# Patient Record
Sex: Female | Born: 1940 | Race: White | Hispanic: No | Marital: Single | Smoking: Former smoker
Health system: Southern US, Community
[De-identification: ages and names within clinical notes are randomized; demographics above are authoritative.]

## PROBLEM LIST (undated history)

## (undated) DIAGNOSIS — E119 Type 2 diabetes mellitus without complications: Secondary | ICD-10-CM

## (undated) DIAGNOSIS — C801 Malignant (primary) neoplasm, unspecified: Secondary | ICD-10-CM

## (undated) DIAGNOSIS — R413 Other amnesia: Secondary | ICD-10-CM

## (undated) DIAGNOSIS — I5181 Takotsubo syndrome: Secondary | ICD-10-CM

## (undated) DIAGNOSIS — J449 Chronic obstructive pulmonary disease, unspecified: Secondary | ICD-10-CM

## (undated) HISTORY — PX: ABDOMINAL HYSTERECTOMY: SHX81

---

## 2016-01-17 DIAGNOSIS — E1122 Type 2 diabetes mellitus with diabetic chronic kidney disease: Secondary | ICD-10-CM | POA: Diagnosis not present

## 2016-01-17 DIAGNOSIS — N183 Chronic kidney disease, stage 3 (moderate): Secondary | ICD-10-CM | POA: Diagnosis not present

## 2016-01-17 DIAGNOSIS — Z794 Long term (current) use of insulin: Secondary | ICD-10-CM | POA: Diagnosis not present

## 2016-01-22 DIAGNOSIS — I129 Hypertensive chronic kidney disease with stage 1 through stage 4 chronic kidney disease, or unspecified chronic kidney disease: Secondary | ICD-10-CM | POA: Diagnosis not present

## 2016-01-22 DIAGNOSIS — J22 Unspecified acute lower respiratory infection: Secondary | ICD-10-CM | POA: Diagnosis not present

## 2016-01-22 DIAGNOSIS — N183 Chronic kidney disease, stage 3 (moderate): Secondary | ICD-10-CM | POA: Diagnosis not present

## 2016-01-22 DIAGNOSIS — J449 Chronic obstructive pulmonary disease, unspecified: Secondary | ICD-10-CM | POA: Diagnosis not present

## 2016-02-05 DIAGNOSIS — R911 Solitary pulmonary nodule: Secondary | ICD-10-CM | POA: Diagnosis not present

## 2016-02-05 DIAGNOSIS — J439 Emphysema, unspecified: Secondary | ICD-10-CM | POA: Diagnosis not present

## 2016-02-05 DIAGNOSIS — I700 Atherosclerosis of aorta: Secondary | ICD-10-CM | POA: Diagnosis not present

## 2016-02-05 DIAGNOSIS — I2510 Atherosclerotic heart disease of native coronary artery without angina pectoris: Secondary | ICD-10-CM | POA: Diagnosis not present

## 2016-02-05 DIAGNOSIS — J22 Unspecified acute lower respiratory infection: Secondary | ICD-10-CM | POA: Diagnosis not present

## 2016-02-05 DIAGNOSIS — N183 Chronic kidney disease, stage 3 (moderate): Secondary | ICD-10-CM | POA: Diagnosis not present

## 2016-02-05 DIAGNOSIS — I5181 Takotsubo syndrome: Secondary | ICD-10-CM | POA: Diagnosis not present

## 2016-02-05 DIAGNOSIS — J449 Chronic obstructive pulmonary disease, unspecified: Secondary | ICD-10-CM | POA: Diagnosis not present

## 2016-02-11 DIAGNOSIS — K1120 Sialoadenitis, unspecified: Secondary | ICD-10-CM | POA: Diagnosis not present

## 2016-02-16 DIAGNOSIS — M546 Pain in thoracic spine: Secondary | ICD-10-CM | POA: Diagnosis not present

## 2016-02-16 DIAGNOSIS — Z794 Long term (current) use of insulin: Secondary | ICD-10-CM | POA: Diagnosis not present

## 2016-02-16 DIAGNOSIS — N183 Chronic kidney disease, stage 3 (moderate): Secondary | ICD-10-CM | POA: Diagnosis not present

## 2016-02-16 DIAGNOSIS — J449 Chronic obstructive pulmonary disease, unspecified: Secondary | ICD-10-CM | POA: Diagnosis not present

## 2016-02-16 DIAGNOSIS — E1122 Type 2 diabetes mellitus with diabetic chronic kidney disease: Secondary | ICD-10-CM | POA: Diagnosis not present

## 2016-02-17 DIAGNOSIS — I129 Hypertensive chronic kidney disease with stage 1 through stage 4 chronic kidney disease, or unspecified chronic kidney disease: Secondary | ICD-10-CM | POA: Diagnosis not present

## 2016-02-17 DIAGNOSIS — N183 Chronic kidney disease, stage 3 (moderate): Secondary | ICD-10-CM | POA: Diagnosis not present

## 2016-02-17 DIAGNOSIS — J449 Chronic obstructive pulmonary disease, unspecified: Secondary | ICD-10-CM | POA: Diagnosis not present

## 2016-02-17 DIAGNOSIS — F413 Other mixed anxiety disorders: Secondary | ICD-10-CM | POA: Diagnosis not present

## 2016-02-17 DIAGNOSIS — I5181 Takotsubo syndrome: Secondary | ICD-10-CM | POA: Diagnosis not present

## 2016-03-10 DIAGNOSIS — E1122 Type 2 diabetes mellitus with diabetic chronic kidney disease: Secondary | ICD-10-CM | POA: Diagnosis not present

## 2016-03-10 DIAGNOSIS — J449 Chronic obstructive pulmonary disease, unspecified: Secondary | ICD-10-CM | POA: Diagnosis not present

## 2016-03-10 DIAGNOSIS — N183 Chronic kidney disease, stage 3 (moderate): Secondary | ICD-10-CM | POA: Diagnosis not present

## 2016-03-10 DIAGNOSIS — E039 Hypothyroidism, unspecified: Secondary | ICD-10-CM | POA: Diagnosis not present

## 2016-03-10 DIAGNOSIS — Z794 Long term (current) use of insulin: Secondary | ICD-10-CM | POA: Diagnosis not present

## 2016-03-10 DIAGNOSIS — I129 Hypertensive chronic kidney disease with stage 1 through stage 4 chronic kidney disease, or unspecified chronic kidney disease: Secondary | ICD-10-CM | POA: Diagnosis not present

## 2016-03-10 DIAGNOSIS — E1142 Type 2 diabetes mellitus with diabetic polyneuropathy: Secondary | ICD-10-CM | POA: Diagnosis not present

## 2016-03-11 DIAGNOSIS — N183 Chronic kidney disease, stage 3 (moderate): Secondary | ICD-10-CM | POA: Diagnosis not present

## 2016-03-11 DIAGNOSIS — E1122 Type 2 diabetes mellitus with diabetic chronic kidney disease: Secondary | ICD-10-CM | POA: Diagnosis not present

## 2016-03-11 DIAGNOSIS — I129 Hypertensive chronic kidney disease with stage 1 through stage 4 chronic kidney disease, or unspecified chronic kidney disease: Secondary | ICD-10-CM | POA: Diagnosis not present

## 2016-03-11 DIAGNOSIS — Z794 Long term (current) use of insulin: Secondary | ICD-10-CM | POA: Diagnosis not present

## 2016-03-19 DIAGNOSIS — Z794 Long term (current) use of insulin: Secondary | ICD-10-CM | POA: Diagnosis not present

## 2016-03-19 DIAGNOSIS — E1142 Type 2 diabetes mellitus with diabetic polyneuropathy: Secondary | ICD-10-CM | POA: Diagnosis not present

## 2016-05-01 DIAGNOSIS — Z794 Long term (current) use of insulin: Secondary | ICD-10-CM | POA: Diagnosis not present

## 2016-05-01 DIAGNOSIS — N183 Chronic kidney disease, stage 3 (moderate): Secondary | ICD-10-CM | POA: Diagnosis not present

## 2016-05-01 DIAGNOSIS — E1142 Type 2 diabetes mellitus with diabetic polyneuropathy: Secondary | ICD-10-CM | POA: Diagnosis not present

## 2016-05-01 DIAGNOSIS — E1122 Type 2 diabetes mellitus with diabetic chronic kidney disease: Secondary | ICD-10-CM | POA: Diagnosis not present

## 2016-05-14 DIAGNOSIS — C4492 Squamous cell carcinoma of skin, unspecified: Secondary | ICD-10-CM | POA: Diagnosis not present

## 2016-05-15 DIAGNOSIS — D1801 Hemangioma of skin and subcutaneous tissue: Secondary | ICD-10-CM | POA: Diagnosis not present

## 2016-05-15 DIAGNOSIS — D692 Other nonthrombocytopenic purpura: Secondary | ICD-10-CM | POA: Diagnosis not present

## 2016-05-15 DIAGNOSIS — D485 Neoplasm of uncertain behavior of skin: Secondary | ICD-10-CM | POA: Diagnosis not present

## 2016-05-15 DIAGNOSIS — C44629 Squamous cell carcinoma of skin of left upper limb, including shoulder: Secondary | ICD-10-CM | POA: Diagnosis not present

## 2016-06-16 DIAGNOSIS — N183 Chronic kidney disease, stage 3 (moderate): Secondary | ICD-10-CM | POA: Diagnosis not present

## 2016-06-16 DIAGNOSIS — J441 Chronic obstructive pulmonary disease with (acute) exacerbation: Secondary | ICD-10-CM | POA: Diagnosis not present

## 2016-06-16 DIAGNOSIS — I5181 Takotsubo syndrome: Secondary | ICD-10-CM | POA: Diagnosis not present

## 2016-06-16 DIAGNOSIS — J449 Chronic obstructive pulmonary disease, unspecified: Secondary | ICD-10-CM | POA: Diagnosis not present

## 2016-07-17 DIAGNOSIS — L905 Scar conditions and fibrosis of skin: Secondary | ICD-10-CM | POA: Diagnosis not present

## 2016-07-17 DIAGNOSIS — C44629 Squamous cell carcinoma of skin of left upper limb, including shoulder: Secondary | ICD-10-CM | POA: Diagnosis not present

## 2016-08-27 DIAGNOSIS — D3131 Benign neoplasm of right choroid: Secondary | ICD-10-CM | POA: Diagnosis not present

## 2016-08-27 DIAGNOSIS — Z794 Long term (current) use of insulin: Secondary | ICD-10-CM | POA: Diagnosis not present

## 2016-08-27 DIAGNOSIS — E113291 Type 2 diabetes mellitus with mild nonproliferative diabetic retinopathy without macular edema, right eye: Secondary | ICD-10-CM | POA: Diagnosis not present

## 2016-08-27 DIAGNOSIS — H02834 Dermatochalasis of left upper eyelid: Secondary | ICD-10-CM | POA: Diagnosis not present

## 2016-09-07 DIAGNOSIS — E1122 Type 2 diabetes mellitus with diabetic chronic kidney disease: Secondary | ICD-10-CM | POA: Diagnosis not present

## 2016-09-07 DIAGNOSIS — Z794 Long term (current) use of insulin: Secondary | ICD-10-CM | POA: Diagnosis not present

## 2016-09-07 DIAGNOSIS — I129 Hypertensive chronic kidney disease with stage 1 through stage 4 chronic kidney disease, or unspecified chronic kidney disease: Secondary | ICD-10-CM | POA: Diagnosis not present

## 2016-09-07 DIAGNOSIS — N183 Chronic kidney disease, stage 3 (moderate): Secondary | ICD-10-CM | POA: Diagnosis not present

## 2016-09-07 DIAGNOSIS — E039 Hypothyroidism, unspecified: Secondary | ICD-10-CM | POA: Diagnosis not present

## 2016-09-16 DIAGNOSIS — E1122 Type 2 diabetes mellitus with diabetic chronic kidney disease: Secondary | ICD-10-CM | POA: Diagnosis not present

## 2016-09-16 DIAGNOSIS — I129 Hypertensive chronic kidney disease with stage 1 through stage 4 chronic kidney disease, or unspecified chronic kidney disease: Secondary | ICD-10-CM | POA: Diagnosis not present

## 2016-09-16 DIAGNOSIS — Z794 Long term (current) use of insulin: Secondary | ICD-10-CM | POA: Diagnosis not present

## 2016-09-16 DIAGNOSIS — N183 Chronic kidney disease, stage 3 (moderate): Secondary | ICD-10-CM | POA: Diagnosis not present

## 2016-09-24 DIAGNOSIS — M25561 Pain in right knee: Secondary | ICD-10-CM | POA: Diagnosis not present

## 2016-09-24 DIAGNOSIS — M25562 Pain in left knee: Secondary | ICD-10-CM | POA: Diagnosis not present

## 2016-09-24 DIAGNOSIS — M170 Bilateral primary osteoarthritis of knee: Secondary | ICD-10-CM | POA: Diagnosis not present

## 2016-10-02 DIAGNOSIS — F0390 Unspecified dementia without behavioral disturbance: Secondary | ICD-10-CM | POA: Diagnosis not present

## 2016-10-02 DIAGNOSIS — N183 Chronic kidney disease, stage 3 (moderate): Secondary | ICD-10-CM | POA: Diagnosis not present

## 2016-10-02 DIAGNOSIS — Z794 Long term (current) use of insulin: Secondary | ICD-10-CM | POA: Diagnosis not present

## 2016-10-02 DIAGNOSIS — N390 Urinary tract infection, site not specified: Secondary | ICD-10-CM | POA: Diagnosis not present

## 2016-10-02 DIAGNOSIS — R918 Other nonspecific abnormal finding of lung field: Secondary | ICD-10-CM | POA: Diagnosis not present

## 2016-10-02 DIAGNOSIS — I2510 Atherosclerotic heart disease of native coronary artery without angina pectoris: Secondary | ICD-10-CM | POA: Diagnosis not present

## 2016-10-02 DIAGNOSIS — E1140 Type 2 diabetes mellitus with diabetic neuropathy, unspecified: Secondary | ICD-10-CM | POA: Diagnosis not present

## 2016-10-02 DIAGNOSIS — W01198A Fall on same level from slipping, tripping and stumbling with subsequent striking against other object, initial encounter: Secondary | ICD-10-CM | POA: Diagnosis not present

## 2016-10-02 DIAGNOSIS — R0789 Other chest pain: Secondary | ICD-10-CM | POA: Diagnosis not present

## 2016-10-02 DIAGNOSIS — S270XXA Traumatic pneumothorax, initial encounter: Secondary | ICD-10-CM | POA: Diagnosis not present

## 2016-10-02 DIAGNOSIS — I5181 Takotsubo syndrome: Secondary | ICD-10-CM | POA: Diagnosis not present

## 2016-10-02 DIAGNOSIS — M545 Low back pain: Secondary | ICD-10-CM | POA: Diagnosis not present

## 2016-10-02 DIAGNOSIS — T148XXA Other injury of unspecified body region, initial encounter: Secondary | ICD-10-CM | POA: Diagnosis not present

## 2016-10-02 DIAGNOSIS — E872 Acidosis: Secondary | ICD-10-CM | POA: Diagnosis not present

## 2016-10-02 DIAGNOSIS — S2241XA Multiple fractures of ribs, right side, initial encounter for closed fracture: Secondary | ICD-10-CM | POA: Diagnosis not present

## 2016-10-02 DIAGNOSIS — G44309 Post-traumatic headache, unspecified, not intractable: Secondary | ICD-10-CM | POA: Diagnosis not present

## 2016-10-02 DIAGNOSIS — S272XXA Traumatic hemopneumothorax, initial encounter: Secondary | ICD-10-CM | POA: Diagnosis not present

## 2016-10-02 DIAGNOSIS — I129 Hypertensive chronic kidney disease with stage 1 through stage 4 chronic kidney disease, or unspecified chronic kidney disease: Secondary | ICD-10-CM | POA: Diagnosis not present

## 2016-10-02 DIAGNOSIS — E871 Hypo-osmolality and hyponatremia: Secondary | ICD-10-CM | POA: Diagnosis not present

## 2016-10-02 DIAGNOSIS — N179 Acute kidney failure, unspecified: Secondary | ICD-10-CM | POA: Diagnosis not present

## 2016-10-02 DIAGNOSIS — J984 Other disorders of lung: Secondary | ICD-10-CM | POA: Diagnosis not present

## 2016-10-02 DIAGNOSIS — D62 Acute posthemorrhagic anemia: Secondary | ICD-10-CM | POA: Diagnosis not present

## 2016-10-02 DIAGNOSIS — Y998 Other external cause status: Secondary | ICD-10-CM | POA: Diagnosis not present

## 2016-10-02 DIAGNOSIS — W19XXXA Unspecified fall, initial encounter: Secondary | ICD-10-CM | POA: Diagnosis not present

## 2016-10-02 DIAGNOSIS — J449 Chronic obstructive pulmonary disease, unspecified: Secondary | ICD-10-CM | POA: Diagnosis not present

## 2016-10-02 DIAGNOSIS — E1122 Type 2 diabetes mellitus with diabetic chronic kidney disease: Secondary | ICD-10-CM | POA: Diagnosis not present

## 2016-10-02 DIAGNOSIS — G8911 Acute pain due to trauma: Secondary | ICD-10-CM | POA: Diagnosis not present

## 2016-10-02 DIAGNOSIS — M79601 Pain in right arm: Secondary | ICD-10-CM | POA: Diagnosis not present

## 2016-10-02 DIAGNOSIS — E1142 Type 2 diabetes mellitus with diabetic polyneuropathy: Secondary | ICD-10-CM | POA: Diagnosis not present

## 2016-10-02 DIAGNOSIS — I493 Ventricular premature depolarization: Secondary | ICD-10-CM | POA: Diagnosis not present

## 2016-10-02 DIAGNOSIS — R55 Syncope and collapse: Secondary | ICD-10-CM | POA: Diagnosis not present

## 2016-10-02 DIAGNOSIS — E039 Hypothyroidism, unspecified: Secondary | ICD-10-CM | POA: Diagnosis not present

## 2016-10-02 DIAGNOSIS — R109 Unspecified abdominal pain: Secondary | ICD-10-CM | POA: Diagnosis not present

## 2016-10-02 DIAGNOSIS — T797XXA Traumatic subcutaneous emphysema, initial encounter: Secondary | ICD-10-CM | POA: Diagnosis not present

## 2016-10-02 DIAGNOSIS — M9908 Segmental and somatic dysfunction of rib cage: Secondary | ICD-10-CM | POA: Diagnosis not present

## 2016-10-02 DIAGNOSIS — M542 Cervicalgia: Secondary | ICD-10-CM | POA: Diagnosis not present

## 2016-10-22 DIAGNOSIS — R079 Chest pain, unspecified: Secondary | ICD-10-CM | POA: Diagnosis not present

## 2016-10-23 DIAGNOSIS — J449 Chronic obstructive pulmonary disease, unspecified: Secondary | ICD-10-CM | POA: Diagnosis not present

## 2016-10-23 DIAGNOSIS — E1122 Type 2 diabetes mellitus with diabetic chronic kidney disease: Secondary | ICD-10-CM | POA: Diagnosis not present

## 2016-10-23 DIAGNOSIS — S2241XD Multiple fractures of ribs, right side, subsequent encounter for fracture with routine healing: Secondary | ICD-10-CM | POA: Diagnosis not present

## 2016-10-23 DIAGNOSIS — I129 Hypertensive chronic kidney disease with stage 1 through stage 4 chronic kidney disease, or unspecified chronic kidney disease: Secondary | ICD-10-CM | POA: Diagnosis not present

## 2016-10-23 DIAGNOSIS — E1142 Type 2 diabetes mellitus with diabetic polyneuropathy: Secondary | ICD-10-CM | POA: Diagnosis not present

## 2016-10-23 DIAGNOSIS — W19XXXD Unspecified fall, subsequent encounter: Secondary | ICD-10-CM | POA: Diagnosis not present

## 2016-10-23 DIAGNOSIS — H02834 Dermatochalasis of left upper eyelid: Secondary | ICD-10-CM | POA: Diagnosis not present

## 2016-10-23 DIAGNOSIS — N183 Chronic kidney disease, stage 3 (moderate): Secondary | ICD-10-CM | POA: Diagnosis not present

## 2016-10-23 DIAGNOSIS — I493 Ventricular premature depolarization: Secondary | ICD-10-CM | POA: Diagnosis not present

## 2016-10-23 DIAGNOSIS — M1712 Unilateral primary osteoarthritis, left knee: Secondary | ICD-10-CM | POA: Diagnosis not present

## 2016-10-23 DIAGNOSIS — M792 Neuralgia and neuritis, unspecified: Secondary | ICD-10-CM | POA: Diagnosis not present

## 2016-10-23 DIAGNOSIS — E039 Hypothyroidism, unspecified: Secondary | ICD-10-CM | POA: Diagnosis not present

## 2016-10-23 DIAGNOSIS — I5181 Takotsubo syndrome: Secondary | ICD-10-CM | POA: Diagnosis not present

## 2016-10-23 DIAGNOSIS — E113291 Type 2 diabetes mellitus with mild nonproliferative diabetic retinopathy without macular edema, right eye: Secondary | ICD-10-CM | POA: Diagnosis not present

## 2016-10-23 DIAGNOSIS — H02831 Dermatochalasis of right upper eyelid: Secondary | ICD-10-CM | POA: Diagnosis not present

## 2016-10-26 DIAGNOSIS — E039 Hypothyroidism, unspecified: Secondary | ICD-10-CM | POA: Diagnosis not present

## 2016-10-26 DIAGNOSIS — E1122 Type 2 diabetes mellitus with diabetic chronic kidney disease: Secondary | ICD-10-CM | POA: Diagnosis not present

## 2016-10-26 DIAGNOSIS — H02831 Dermatochalasis of right upper eyelid: Secondary | ICD-10-CM | POA: Diagnosis not present

## 2016-10-26 DIAGNOSIS — N183 Chronic kidney disease, stage 3 (moderate): Secondary | ICD-10-CM | POA: Diagnosis not present

## 2016-10-26 DIAGNOSIS — S2241XD Multiple fractures of ribs, right side, subsequent encounter for fracture with routine healing: Secondary | ICD-10-CM | POA: Diagnosis not present

## 2016-10-26 DIAGNOSIS — E1142 Type 2 diabetes mellitus with diabetic polyneuropathy: Secondary | ICD-10-CM | POA: Diagnosis not present

## 2016-10-26 DIAGNOSIS — I5181 Takotsubo syndrome: Secondary | ICD-10-CM | POA: Diagnosis not present

## 2016-10-26 DIAGNOSIS — H02834 Dermatochalasis of left upper eyelid: Secondary | ICD-10-CM | POA: Diagnosis not present

## 2016-10-26 DIAGNOSIS — I493 Ventricular premature depolarization: Secondary | ICD-10-CM | POA: Diagnosis not present

## 2016-10-26 DIAGNOSIS — J449 Chronic obstructive pulmonary disease, unspecified: Secondary | ICD-10-CM | POA: Diagnosis not present

## 2016-10-26 DIAGNOSIS — I129 Hypertensive chronic kidney disease with stage 1 through stage 4 chronic kidney disease, or unspecified chronic kidney disease: Secondary | ICD-10-CM | POA: Diagnosis not present

## 2016-10-26 DIAGNOSIS — M1712 Unilateral primary osteoarthritis, left knee: Secondary | ICD-10-CM | POA: Diagnosis not present

## 2016-10-26 DIAGNOSIS — E113291 Type 2 diabetes mellitus with mild nonproliferative diabetic retinopathy without macular edema, right eye: Secondary | ICD-10-CM | POA: Diagnosis not present

## 2016-10-29 DIAGNOSIS — S270XXD Traumatic pneumothorax, subsequent encounter: Secondary | ICD-10-CM | POA: Diagnosis not present

## 2016-10-29 DIAGNOSIS — S2241XD Multiple fractures of ribs, right side, subsequent encounter for fracture with routine healing: Secondary | ICD-10-CM | POA: Diagnosis not present

## 2016-11-02 DIAGNOSIS — E1142 Type 2 diabetes mellitus with diabetic polyneuropathy: Secondary | ICD-10-CM | POA: Diagnosis not present

## 2016-11-02 DIAGNOSIS — I5181 Takotsubo syndrome: Secondary | ICD-10-CM | POA: Diagnosis not present

## 2016-11-02 DIAGNOSIS — I493 Ventricular premature depolarization: Secondary | ICD-10-CM | POA: Diagnosis not present

## 2016-11-02 DIAGNOSIS — J449 Chronic obstructive pulmonary disease, unspecified: Secondary | ICD-10-CM | POA: Diagnosis not present

## 2016-11-02 DIAGNOSIS — M1712 Unilateral primary osteoarthritis, left knee: Secondary | ICD-10-CM | POA: Diagnosis not present

## 2016-11-02 DIAGNOSIS — S2241XD Multiple fractures of ribs, right side, subsequent encounter for fracture with routine healing: Secondary | ICD-10-CM | POA: Diagnosis not present

## 2016-11-02 DIAGNOSIS — E1122 Type 2 diabetes mellitus with diabetic chronic kidney disease: Secondary | ICD-10-CM | POA: Diagnosis not present

## 2016-11-02 DIAGNOSIS — N183 Chronic kidney disease, stage 3 (moderate): Secondary | ICD-10-CM | POA: Diagnosis not present

## 2016-11-02 DIAGNOSIS — I129 Hypertensive chronic kidney disease with stage 1 through stage 4 chronic kidney disease, or unspecified chronic kidney disease: Secondary | ICD-10-CM | POA: Diagnosis not present

## 2016-11-02 DIAGNOSIS — E113291 Type 2 diabetes mellitus with mild nonproliferative diabetic retinopathy without macular edema, right eye: Secondary | ICD-10-CM | POA: Diagnosis not present

## 2016-11-02 DIAGNOSIS — H02831 Dermatochalasis of right upper eyelid: Secondary | ICD-10-CM | POA: Diagnosis not present

## 2016-11-02 DIAGNOSIS — H02834 Dermatochalasis of left upper eyelid: Secondary | ICD-10-CM | POA: Diagnosis not present

## 2016-11-02 DIAGNOSIS — E039 Hypothyroidism, unspecified: Secondary | ICD-10-CM | POA: Diagnosis not present

## 2016-11-09 DIAGNOSIS — E1142 Type 2 diabetes mellitus with diabetic polyneuropathy: Secondary | ICD-10-CM | POA: Diagnosis not present

## 2016-11-09 DIAGNOSIS — J449 Chronic obstructive pulmonary disease, unspecified: Secondary | ICD-10-CM | POA: Diagnosis not present

## 2016-11-09 DIAGNOSIS — N183 Chronic kidney disease, stage 3 (moderate): Secondary | ICD-10-CM | POA: Diagnosis not present

## 2016-11-09 DIAGNOSIS — E039 Hypothyroidism, unspecified: Secondary | ICD-10-CM | POA: Diagnosis not present

## 2016-11-09 DIAGNOSIS — M1712 Unilateral primary osteoarthritis, left knee: Secondary | ICD-10-CM | POA: Diagnosis not present

## 2016-11-09 DIAGNOSIS — I129 Hypertensive chronic kidney disease with stage 1 through stage 4 chronic kidney disease, or unspecified chronic kidney disease: Secondary | ICD-10-CM | POA: Diagnosis not present

## 2016-11-09 DIAGNOSIS — S2241XD Multiple fractures of ribs, right side, subsequent encounter for fracture with routine healing: Secondary | ICD-10-CM | POA: Diagnosis not present

## 2016-11-09 DIAGNOSIS — E1122 Type 2 diabetes mellitus with diabetic chronic kidney disease: Secondary | ICD-10-CM | POA: Diagnosis not present

## 2016-11-09 DIAGNOSIS — E113291 Type 2 diabetes mellitus with mild nonproliferative diabetic retinopathy without macular edema, right eye: Secondary | ICD-10-CM | POA: Diagnosis not present

## 2016-11-09 DIAGNOSIS — H02834 Dermatochalasis of left upper eyelid: Secondary | ICD-10-CM | POA: Diagnosis not present

## 2016-11-09 DIAGNOSIS — I5181 Takotsubo syndrome: Secondary | ICD-10-CM | POA: Diagnosis not present

## 2016-11-09 DIAGNOSIS — H02831 Dermatochalasis of right upper eyelid: Secondary | ICD-10-CM | POA: Diagnosis not present

## 2016-11-09 DIAGNOSIS — I493 Ventricular premature depolarization: Secondary | ICD-10-CM | POA: Diagnosis not present

## 2016-11-16 DIAGNOSIS — E039 Hypothyroidism, unspecified: Secondary | ICD-10-CM | POA: Diagnosis not present

## 2016-11-16 DIAGNOSIS — I5181 Takotsubo syndrome: Secondary | ICD-10-CM | POA: Diagnosis not present

## 2016-11-16 DIAGNOSIS — H02834 Dermatochalasis of left upper eyelid: Secondary | ICD-10-CM | POA: Diagnosis not present

## 2016-11-16 DIAGNOSIS — I129 Hypertensive chronic kidney disease with stage 1 through stage 4 chronic kidney disease, or unspecified chronic kidney disease: Secondary | ICD-10-CM | POA: Diagnosis not present

## 2016-11-16 DIAGNOSIS — I493 Ventricular premature depolarization: Secondary | ICD-10-CM | POA: Diagnosis not present

## 2016-11-16 DIAGNOSIS — E113291 Type 2 diabetes mellitus with mild nonproliferative diabetic retinopathy without macular edema, right eye: Secondary | ICD-10-CM | POA: Diagnosis not present

## 2016-11-16 DIAGNOSIS — E1142 Type 2 diabetes mellitus with diabetic polyneuropathy: Secondary | ICD-10-CM | POA: Diagnosis not present

## 2016-11-16 DIAGNOSIS — M1712 Unilateral primary osteoarthritis, left knee: Secondary | ICD-10-CM | POA: Diagnosis not present

## 2016-11-16 DIAGNOSIS — N183 Chronic kidney disease, stage 3 (moderate): Secondary | ICD-10-CM | POA: Diagnosis not present

## 2016-11-16 DIAGNOSIS — J449 Chronic obstructive pulmonary disease, unspecified: Secondary | ICD-10-CM | POA: Diagnosis not present

## 2016-11-16 DIAGNOSIS — E1122 Type 2 diabetes mellitus with diabetic chronic kidney disease: Secondary | ICD-10-CM | POA: Diagnosis not present

## 2016-11-16 DIAGNOSIS — S2241XD Multiple fractures of ribs, right side, subsequent encounter for fracture with routine healing: Secondary | ICD-10-CM | POA: Diagnosis not present

## 2016-11-16 DIAGNOSIS — H02831 Dermatochalasis of right upper eyelid: Secondary | ICD-10-CM | POA: Diagnosis not present

## 2016-11-30 DIAGNOSIS — J40 Bronchitis, not specified as acute or chronic: Secondary | ICD-10-CM | POA: Diagnosis not present

## 2016-11-30 DIAGNOSIS — R1031 Right lower quadrant pain: Secondary | ICD-10-CM | POA: Diagnosis not present

## 2016-11-30 DIAGNOSIS — J449 Chronic obstructive pulmonary disease, unspecified: Secondary | ICD-10-CM | POA: Diagnosis not present

## 2016-11-30 DIAGNOSIS — S2241XD Multiple fractures of ribs, right side, subsequent encounter for fracture with routine healing: Secondary | ICD-10-CM | POA: Diagnosis not present

## 2016-11-30 DIAGNOSIS — S2241XA Multiple fractures of ribs, right side, initial encounter for closed fracture: Secondary | ICD-10-CM | POA: Diagnosis not present

## 2016-12-10 DIAGNOSIS — R1031 Right lower quadrant pain: Secondary | ICD-10-CM | POA: Diagnosis not present

## 2016-12-10 DIAGNOSIS — N281 Cyst of kidney, acquired: Secondary | ICD-10-CM | POA: Diagnosis not present

## 2016-12-16 DIAGNOSIS — J40 Bronchitis, not specified as acute or chronic: Secondary | ICD-10-CM | POA: Diagnosis not present

## 2016-12-16 DIAGNOSIS — J449 Chronic obstructive pulmonary disease, unspecified: Secondary | ICD-10-CM | POA: Diagnosis not present

## 2016-12-16 DIAGNOSIS — I5181 Takotsubo syndrome: Secondary | ICD-10-CM | POA: Diagnosis not present

## 2017-01-13 DIAGNOSIS — R1031 Right lower quadrant pain: Secondary | ICD-10-CM | POA: Diagnosis not present

## 2017-01-14 DIAGNOSIS — K869 Disease of pancreas, unspecified: Secondary | ICD-10-CM | POA: Diagnosis not present

## 2017-01-14 DIAGNOSIS — R1031 Right lower quadrant pain: Secondary | ICD-10-CM | POA: Diagnosis not present

## 2017-02-11 DIAGNOSIS — E1122 Type 2 diabetes mellitus with diabetic chronic kidney disease: Secondary | ICD-10-CM | POA: Diagnosis not present

## 2017-02-11 DIAGNOSIS — E1142 Type 2 diabetes mellitus with diabetic polyneuropathy: Secondary | ICD-10-CM | POA: Diagnosis not present

## 2017-02-11 DIAGNOSIS — N183 Chronic kidney disease, stage 3 (moderate): Secondary | ICD-10-CM | POA: Diagnosis not present

## 2017-02-11 DIAGNOSIS — I129 Hypertensive chronic kidney disease with stage 1 through stage 4 chronic kidney disease, or unspecified chronic kidney disease: Secondary | ICD-10-CM | POA: Diagnosis not present

## 2017-03-15 DIAGNOSIS — I129 Hypertensive chronic kidney disease with stage 1 through stage 4 chronic kidney disease, or unspecified chronic kidney disease: Secondary | ICD-10-CM | POA: Diagnosis not present

## 2017-03-15 DIAGNOSIS — E1122 Type 2 diabetes mellitus with diabetic chronic kidney disease: Secondary | ICD-10-CM | POA: Diagnosis not present

## 2017-03-15 DIAGNOSIS — Z0000 Encounter for general adult medical examination without abnormal findings: Secondary | ICD-10-CM | POA: Diagnosis not present

## 2017-03-15 DIAGNOSIS — N183 Chronic kidney disease, stage 3 (moderate): Secondary | ICD-10-CM | POA: Diagnosis not present

## 2017-03-15 DIAGNOSIS — E039 Hypothyroidism, unspecified: Secondary | ICD-10-CM | POA: Diagnosis not present

## 2017-03-22 DIAGNOSIS — Z111 Encounter for screening for respiratory tuberculosis: Secondary | ICD-10-CM | POA: Diagnosis not present

## 2017-03-25 DIAGNOSIS — Z111 Encounter for screening for respiratory tuberculosis: Secondary | ICD-10-CM | POA: Diagnosis not present

## 2017-07-23 DIAGNOSIS — I5181 Takotsubo syndrome: Secondary | ICD-10-CM | POA: Diagnosis not present

## 2017-07-23 DIAGNOSIS — I129 Hypertensive chronic kidney disease with stage 1 through stage 4 chronic kidney disease, or unspecified chronic kidney disease: Secondary | ICD-10-CM | POA: Diagnosis not present

## 2017-07-23 DIAGNOSIS — J309 Allergic rhinitis, unspecified: Secondary | ICD-10-CM | POA: Diagnosis not present

## 2017-07-23 DIAGNOSIS — J449 Chronic obstructive pulmonary disease, unspecified: Secondary | ICD-10-CM | POA: Diagnosis not present

## 2017-08-19 DIAGNOSIS — R413 Other amnesia: Secondary | ICD-10-CM | POA: Diagnosis not present

## 2017-08-19 DIAGNOSIS — L710 Perioral dermatitis: Secondary | ICD-10-CM | POA: Diagnosis not present

## 2017-08-26 DIAGNOSIS — J441 Chronic obstructive pulmonary disease with (acute) exacerbation: Secondary | ICD-10-CM | POA: Diagnosis not present

## 2017-09-03 DIAGNOSIS — D3131 Benign neoplasm of right choroid: Secondary | ICD-10-CM | POA: Diagnosis not present

## 2017-09-03 DIAGNOSIS — H02834 Dermatochalasis of left upper eyelid: Secondary | ICD-10-CM | POA: Diagnosis not present

## 2017-09-03 DIAGNOSIS — Z961 Presence of intraocular lens: Secondary | ICD-10-CM | POA: Diagnosis not present

## 2017-09-03 DIAGNOSIS — E113291 Type 2 diabetes mellitus with mild nonproliferative diabetic retinopathy without macular edema, right eye: Secondary | ICD-10-CM | POA: Diagnosis not present

## 2017-09-03 DIAGNOSIS — Z794 Long term (current) use of insulin: Secondary | ICD-10-CM | POA: Diagnosis not present

## 2017-09-03 DIAGNOSIS — H02831 Dermatochalasis of right upper eyelid: Secondary | ICD-10-CM | POA: Diagnosis not present

## 2017-09-03 DIAGNOSIS — H35363 Drusen (degenerative) of macula, bilateral: Secondary | ICD-10-CM | POA: Diagnosis not present

## 2017-09-17 DIAGNOSIS — Z794 Long term (current) use of insulin: Secondary | ICD-10-CM | POA: Diagnosis not present

## 2017-09-17 DIAGNOSIS — N183 Chronic kidney disease, stage 3 (moderate): Secondary | ICD-10-CM | POA: Diagnosis not present

## 2017-09-17 DIAGNOSIS — E1121 Type 2 diabetes mellitus with diabetic nephropathy: Secondary | ICD-10-CM | POA: Diagnosis not present

## 2017-09-17 DIAGNOSIS — J42 Unspecified chronic bronchitis: Secondary | ICD-10-CM | POA: Diagnosis not present

## 2017-09-17 DIAGNOSIS — R413 Other amnesia: Secondary | ICD-10-CM | POA: Diagnosis not present

## 2017-09-17 DIAGNOSIS — E039 Hypothyroidism, unspecified: Secondary | ICD-10-CM | POA: Diagnosis not present

## 2017-09-17 DIAGNOSIS — E1122 Type 2 diabetes mellitus with diabetic chronic kidney disease: Secondary | ICD-10-CM | POA: Diagnosis not present

## 2017-10-01 DIAGNOSIS — J449 Chronic obstructive pulmonary disease, unspecified: Secondary | ICD-10-CM | POA: Diagnosis not present

## 2017-10-15 DIAGNOSIS — R413 Other amnesia: Secondary | ICD-10-CM | POA: Diagnosis not present

## 2017-12-14 DIAGNOSIS — M25561 Pain in right knee: Secondary | ICD-10-CM | POA: Diagnosis not present

## 2017-12-14 DIAGNOSIS — M25572 Pain in left ankle and joints of left foot: Secondary | ICD-10-CM | POA: Diagnosis not present

## 2017-12-17 DIAGNOSIS — G301 Alzheimer's disease with late onset: Secondary | ICD-10-CM | POA: Diagnosis not present

## 2017-12-17 DIAGNOSIS — R413 Other amnesia: Secondary | ICD-10-CM | POA: Diagnosis not present

## 2017-12-17 DIAGNOSIS — F0280 Dementia in other diseases classified elsewhere without behavioral disturbance: Secondary | ICD-10-CM | POA: Diagnosis not present

## 2017-12-22 DIAGNOSIS — J441 Chronic obstructive pulmonary disease with (acute) exacerbation: Secondary | ICD-10-CM | POA: Diagnosis not present

## 2018-01-26 DIAGNOSIS — N183 Chronic kidney disease, stage 3 (moderate): Secondary | ICD-10-CM | POA: Diagnosis not present

## 2018-01-26 DIAGNOSIS — Z794 Long term (current) use of insulin: Secondary | ICD-10-CM | POA: Diagnosis not present

## 2018-01-26 DIAGNOSIS — I129 Hypertensive chronic kidney disease with stage 1 through stage 4 chronic kidney disease, or unspecified chronic kidney disease: Secondary | ICD-10-CM | POA: Diagnosis not present

## 2018-01-26 DIAGNOSIS — E1122 Type 2 diabetes mellitus with diabetic chronic kidney disease: Secondary | ICD-10-CM | POA: Diagnosis not present

## 2018-01-28 DIAGNOSIS — F0280 Dementia in other diseases classified elsewhere without behavioral disturbance: Secondary | ICD-10-CM | POA: Diagnosis not present

## 2018-01-28 DIAGNOSIS — G301 Alzheimer's disease with late onset: Secondary | ICD-10-CM | POA: Diagnosis not present

## 2018-02-11 DIAGNOSIS — N183 Chronic kidney disease, stage 3 (moderate): Secondary | ICD-10-CM | POA: Diagnosis not present

## 2018-02-11 DIAGNOSIS — I5181 Takotsubo syndrome: Secondary | ICD-10-CM | POA: Diagnosis not present

## 2018-02-11 DIAGNOSIS — I493 Ventricular premature depolarization: Secondary | ICD-10-CM | POA: Diagnosis not present

## 2018-02-11 DIAGNOSIS — J441 Chronic obstructive pulmonary disease with (acute) exacerbation: Secondary | ICD-10-CM | POA: Diagnosis not present

## 2018-03-05 DIAGNOSIS — J209 Acute bronchitis, unspecified: Secondary | ICD-10-CM | POA: Diagnosis not present

## 2018-03-15 DIAGNOSIS — M25572 Pain in left ankle and joints of left foot: Secondary | ICD-10-CM | POA: Diagnosis not present

## 2018-03-15 DIAGNOSIS — M25561 Pain in right knee: Secondary | ICD-10-CM | POA: Diagnosis not present

## 2018-03-15 DIAGNOSIS — E039 Hypothyroidism, unspecified: Secondary | ICD-10-CM | POA: Diagnosis not present

## 2018-03-18 DIAGNOSIS — E1122 Type 2 diabetes mellitus with diabetic chronic kidney disease: Secondary | ICD-10-CM | POA: Diagnosis not present

## 2018-03-18 DIAGNOSIS — N183 Chronic kidney disease, stage 3 (moderate): Secondary | ICD-10-CM | POA: Diagnosis not present

## 2018-03-18 DIAGNOSIS — E039 Hypothyroidism, unspecified: Secondary | ICD-10-CM | POA: Diagnosis not present

## 2018-03-18 DIAGNOSIS — N189 Chronic kidney disease, unspecified: Secondary | ICD-10-CM | POA: Diagnosis not present

## 2018-03-18 DIAGNOSIS — Z794 Long term (current) use of insulin: Secondary | ICD-10-CM | POA: Diagnosis not present

## 2018-03-18 DIAGNOSIS — F0280 Dementia in other diseases classified elsewhere without behavioral disturbance: Secondary | ICD-10-CM | POA: Diagnosis not present

## 2018-03-18 DIAGNOSIS — G301 Alzheimer's disease with late onset: Secondary | ICD-10-CM | POA: Diagnosis not present

## 2018-03-18 DIAGNOSIS — I129 Hypertensive chronic kidney disease with stage 1 through stage 4 chronic kidney disease, or unspecified chronic kidney disease: Secondary | ICD-10-CM | POA: Diagnosis not present

## 2018-03-18 DIAGNOSIS — E1140 Type 2 diabetes mellitus with diabetic neuropathy, unspecified: Secondary | ICD-10-CM | POA: Diagnosis not present

## 2018-06-02 DIAGNOSIS — K861 Other chronic pancreatitis: Secondary | ICD-10-CM | POA: Diagnosis not present

## 2018-06-02 DIAGNOSIS — S46911A Strain of unspecified muscle, fascia and tendon at shoulder and upper arm level, right arm, initial encounter: Secondary | ICD-10-CM | POA: Diagnosis not present

## 2018-07-29 DIAGNOSIS — F0280 Dementia in other diseases classified elsewhere without behavioral disturbance: Secondary | ICD-10-CM | POA: Diagnosis not present

## 2018-07-29 DIAGNOSIS — G301 Alzheimer's disease with late onset: Secondary | ICD-10-CM | POA: Diagnosis not present

## 2018-09-09 DIAGNOSIS — Z794 Long term (current) use of insulin: Secondary | ICD-10-CM | POA: Diagnosis not present

## 2018-09-09 DIAGNOSIS — E113291 Type 2 diabetes mellitus with mild nonproliferative diabetic retinopathy without macular edema, right eye: Secondary | ICD-10-CM | POA: Diagnosis not present

## 2018-09-09 DIAGNOSIS — D3131 Benign neoplasm of right choroid: Secondary | ICD-10-CM | POA: Diagnosis not present

## 2018-09-09 DIAGNOSIS — H35363 Drusen (degenerative) of macula, bilateral: Secondary | ICD-10-CM | POA: Diagnosis not present

## 2018-09-09 DIAGNOSIS — Z961 Presence of intraocular lens: Secondary | ICD-10-CM | POA: Diagnosis not present

## 2018-09-23 DIAGNOSIS — I129 Hypertensive chronic kidney disease with stage 1 through stage 4 chronic kidney disease, or unspecified chronic kidney disease: Secondary | ICD-10-CM | POA: Diagnosis not present

## 2018-09-23 DIAGNOSIS — J441 Chronic obstructive pulmonary disease with (acute) exacerbation: Secondary | ICD-10-CM | POA: Diagnosis not present

## 2018-09-23 DIAGNOSIS — J309 Allergic rhinitis, unspecified: Secondary | ICD-10-CM | POA: Diagnosis not present

## 2018-09-23 DIAGNOSIS — Z87891 Personal history of nicotine dependence: Secondary | ICD-10-CM | POA: Diagnosis not present

## 2018-09-23 DIAGNOSIS — N183 Chronic kidney disease, stage 3 (moderate): Secondary | ICD-10-CM | POA: Diagnosis not present

## 2018-10-13 DIAGNOSIS — S72009A Fracture of unspecified part of neck of unspecified femur, initial encounter for closed fracture: Secondary | ICD-10-CM

## 2018-10-13 HISTORY — DX: Fracture of unspecified part of neck of unspecified femur, initial encounter for closed fracture: S72.009A

## 2018-10-27 ENCOUNTER — Other Ambulatory Visit: Payer: Self-pay

## 2018-10-27 ENCOUNTER — Emergency Department (HOSPITAL_BASED_OUTPATIENT_CLINIC_OR_DEPARTMENT_OTHER): Payer: PPO

## 2018-10-27 ENCOUNTER — Encounter (HOSPITAL_BASED_OUTPATIENT_CLINIC_OR_DEPARTMENT_OTHER): Payer: Self-pay | Admitting: Emergency Medicine

## 2018-10-27 ENCOUNTER — Emergency Department (HOSPITAL_BASED_OUTPATIENT_CLINIC_OR_DEPARTMENT_OTHER)
Admission: EM | Admit: 2018-10-27 | Discharge: 2018-10-27 | Disposition: A | Discharge status: Home / Self Care | Payer: PPO | Attending: Emergency Medicine | Admitting: Emergency Medicine

## 2018-10-27 DIAGNOSIS — Y939 Activity, unspecified: Secondary | ICD-10-CM | POA: Insufficient documentation

## 2018-10-27 DIAGNOSIS — Z888 Allergy status to other drugs, medicaments and biological substances status: Secondary | ICD-10-CM | POA: Diagnosis not present

## 2018-10-27 DIAGNOSIS — Z87891 Personal history of nicotine dependence: Secondary | ICD-10-CM | POA: Insufficient documentation

## 2018-10-27 DIAGNOSIS — M25552 Pain in left hip: Secondary | ICD-10-CM | POA: Diagnosis not present

## 2018-10-27 DIAGNOSIS — S32512A Fracture of superior rim of left pubis, initial encounter for closed fracture: Secondary | ICD-10-CM | POA: Diagnosis not present

## 2018-10-27 DIAGNOSIS — S32502A Unspecified fracture of left pubis, initial encounter for closed fracture: Secondary | ICD-10-CM | POA: Insufficient documentation

## 2018-10-27 DIAGNOSIS — W19XXXA Unspecified fall, initial encounter: Secondary | ICD-10-CM | POA: Diagnosis not present

## 2018-10-27 DIAGNOSIS — Y999 Unspecified external cause status: Secondary | ICD-10-CM | POA: Insufficient documentation

## 2018-10-27 DIAGNOSIS — S32592A Other specified fracture of left pubis, initial encounter for closed fracture: Secondary | ICD-10-CM

## 2018-10-27 DIAGNOSIS — R9431 Abnormal electrocardiogram [ECG] [EKG]: Secondary | ICD-10-CM | POA: Diagnosis not present

## 2018-10-27 DIAGNOSIS — E119 Type 2 diabetes mellitus without complications: Secondary | ICD-10-CM | POA: Diagnosis not present

## 2018-10-27 DIAGNOSIS — S79912A Unspecified injury of left hip, initial encounter: Secondary | ICD-10-CM | POA: Diagnosis not present

## 2018-10-27 DIAGNOSIS — Z794 Long term (current) use of insulin: Secondary | ICD-10-CM | POA: Insufficient documentation

## 2018-10-27 DIAGNOSIS — Y92002 Bathroom of unspecified non-institutional (private) residence single-family (private) house as the place of occurrence of the external cause: Secondary | ICD-10-CM | POA: Diagnosis not present

## 2018-10-27 DIAGNOSIS — J449 Chronic obstructive pulmonary disease, unspecified: Secondary | ICD-10-CM | POA: Insufficient documentation

## 2018-10-27 DIAGNOSIS — Z79899 Other long term (current) drug therapy: Secondary | ICD-10-CM | POA: Diagnosis not present

## 2018-10-27 DIAGNOSIS — S3992XA Unspecified injury of lower back, initial encounter: Secondary | ICD-10-CM | POA: Diagnosis not present

## 2018-10-27 HISTORY — DX: Chronic obstructive pulmonary disease, unspecified: J44.9

## 2018-10-27 HISTORY — DX: Type 2 diabetes mellitus without complications: E11.9

## 2018-10-27 MED ORDER — KETOROLAC TROMETHAMINE 60 MG/2ML IM SOLN
60.0000 mg | Freq: Once | INTRAMUSCULAR | Status: AC
  Administered 2018-10-27: 60 mg via INTRAMUSCULAR
  Filled 2018-10-27: qty 2

## 2018-10-27 MED ORDER — ACETAMINOPHEN 500 MG PO TABS
1000.0000 mg | ORAL_TABLET | Freq: Once | ORAL | Status: AC
  Administered 2018-10-27: 1000 mg via ORAL
  Filled 2018-10-27: qty 2

## 2018-10-27 MED ORDER — OXYCODONE HCL 5 MG PO TABS: 5.0000 mg | ORAL_TABLET | ORAL | 0 refills | Status: AC | PRN

## 2018-10-27 MED ORDER — LIDOCAINE 5 % EX PTCH: 1.0000 | MEDICATED_PATCH | CUTANEOUS | 0 refills | Status: AC

## 2018-10-27 NOTE — Discharge Instructions (Signed)
Take Tylenol 1000 mg 4 times a day for 1 week. This is the maximum dose of Tylenol (acetaminophen) you can from all sources. Please check other over-the-counter medications and prescriptions to ensure you are not taking other medications that contain acetaminophen.  You may also take ibuprofen 400 mg 6 times a day alternating with or at the same time as tylenol.  Take oxycodone as needed for breakthrough pain.  This medication can be addicting, sedating and cause constipation.

## 2018-10-27 NOTE — ED Triage Notes (Signed)
Patient presents with complaints of left hip pain sp fall this am in the bathroom. Denies and LOC; able to bear some weight on left lower extremity

## 2018-10-27 NOTE — ED Notes (Signed)
Patient transported to X-ray 

## 2018-10-27 NOTE — ED Provider Notes (Addendum)
[facility] EMERGENCY DEPARTMENT Provider Note   CSN: [id number] Arrival date & time: 10/27/18  [id number]     History   Chief Complaint Chief Complaint  Patient presents with  . Fall    HPI Lisa Nelson is a 78 y.o. female.     HPI  78yo female with history of memory problems, COPD, and diabetes presents with concern for fall.  Patient does not remember the fall, but daughter reports that this is not unusual given her history of memory problems.  Her mother fall in the bathroom, and immediately went to her side.  The patient denies having any loss of consciousness, and daughter does not believe that she lost consciousness.  Patient denies hitting her head, having headache, neck pain.  Reports hip pain which is severe, worse with weightbearing.  She did vomit in setting of hip pain. No continuing nausea, no numbness/weakness. No chest pain/dyspnea.  Was able to bear weight after the fall but with pain. Not on anticoagulation.    Past Medical History:  Diagnosis Date  . COPD (chronic obstructive pulmonary disease) ([place])   . Diabetes mellitus without complication ([place])     There are no active problems to display for this patient.   Past Surgical History:  Procedure Laterality Date  . ABDOMINAL HYSTERECTOMY       OB History   No obstetric history on file.      Home Medications    Prior to Admission medications   Medication Sig Start Date End Date Taking? Authorizing Provider  atorvastatin (LIPITOR) 20 MG tablet Take by mouth. 01/26/18  Yes [provider]  donepezil (ARICEPT) 10 MG tablet TAKE ONE TABLET BY MOUTH  EVERY EVENING 09/13/15  Yes [provider]  hydrOXYzine (ATARAX/VISTARIL) 25 MG tablet 1/2 or 1 tab prn anxiety 07/29/18  Yes [provider]  insulin NPH Human (NOVOLIN N) 100 UNIT/ML injection Inject up to 25 units [place] once daily 10/02/15  Yes [provider]  levothyroxine (SYNTHROID) 88 MCG tablet Take by mouth.  06/22/18  Yes [provider]  lisinopril (ZESTRIL) 20 MG tablet Take by mouth. 01/26/18  Yes [provider]  memantine (NAMENDA) 10 MG tablet Take by mouth. 07/29/18  Yes [provider]  metoprolol tartrate (LOPRESSOR) 25 MG tablet Take 1/2 to 1 tablet daily 03/18/18  Yes [provider]  QUEtiapine (SEROQUEL) 25 MG tablet Take by mouth. 12/17/17  Yes [provider]  Cholecalciferol (VITAMIN D-1000 MAX ST) 25 MCG (1000 UT) tablet Take by mouth.    [provider]  lidocaine (LIDODERM) 5 % Place 1 patch onto the skin daily. Remove & Discard patch within 12 hours or as directed by MD 10/27/18   Gareth Morgan, MD  oxyCODONE (ROXICODONE) 5 MG immediate release tablet Take 1 tablet (5 mg total) by mouth every 4 (four) hours as needed for severe pain. 10/27/18   Gareth Morgan, MD    Family History No family history on file.  Social History Social History   Tobacco Use  . Smoking status: Former Research scientist (life sciences)  . Smokeless tobacco: Never Used  Substance Use Topics  . Alcohol use: Not on file  . Drug use: Not on file     Allergies   Statins   Review of Systems Review of Systems  Constitutional: Negative for fever.  HENT: Negative for sore throat.   Eyes: Negative for visual disturbance.  Respiratory: Negative for cough and shortness of breath.   Cardiovascular: Negative for chest  pain.  Gastrointestinal: Positive for vomiting. Negative for abdominal pain and nausea.  Genitourinary: Negative for difficulty urinating.  Musculoskeletal: Positive for arthralgias, back pain and gait problem (pain with ambulation). Negative for neck pain.  Skin: Negative for rash.  Neurological: Negative for syncope, weakness, numbness and headaches.     Physical Exam Updated Vital Signs BP (!) 166/77 (BP Location: Right Arm)   Pulse 75   Temp 97.6 F (36.4 C) (Oral)   Resp 16   Ht 5\' 4"  (1.626 m)   Wt 59.9 kg   SpO2 94%   BMI 22.66 kg/m    Physical Exam Vitals signs and nursing note reviewed.  Constitutional:      General: She is not in acute distress.    Appearance: She is well-developed. She is not diaphoretic.  HENT:     Head: Normocephalic and atraumatic.     Right Ear: Tympanic membrane normal.     Left Ear: Tympanic membrane normal.  Eyes:     Conjunctiva/sclera: Conjunctivae normal.  Neck:     Musculoskeletal: Normal range of motion.  Cardiovascular:     Rate and Rhythm: Normal rate and regular rhythm.     Heart sounds: Normal heart sounds. No murmur. No friction rub. No gallop.   Pulmonary:     Effort: Pulmonary effort is normal. No respiratory distress.     Breath sounds: Normal breath sounds. No wheezing or rales.  Abdominal:     General: There is no distension.     Palpations: Abdomen is soft.     Tenderness: There is no abdominal tenderness. There is no guarding.  Musculoskeletal:     Left hip: She exhibits bony tenderness. She exhibits no deformity and no laceration. Decreased range of motion: pain with ROM but has good ROM.     Cervical back: She exhibits no tenderness and no bony tenderness.     Thoracic back: She exhibits no tenderness and no bony tenderness.     Lumbar back: She exhibits tenderness and bony tenderness.  Skin:    General: Skin is warm and dry.     Findings: No erythema or rash.  Neurological:     Mental Status: She is alert and oriented to person, place, and time.      ED Treatments / Results  Labs (all labs ordered are listed, but only abnormal results are displayed) Labs Reviewed - No data to display  EKG EKG Interpretation  Date/Time:  Thursday October 27 2018 07:24:05 EDT Ventricular Rate:  68 PR Interval:    QRS Duration: 98 QT Interval:  411 QTC Calculation: 438 R Axis:   -26 Text Interpretation:  Sinus rhythm Borderline left axis deviation Nonspecific T abnormalities, lateral leads No previous ECGs available Confirmed by Gareth Morgan [phone]) on 10/27/2018  9:39:01 PM   Radiology Dg Lumbar Spine Complete  Result Date: 10/27/2018 CLINICAL DATA:  Fall, left hip pain EXAM: LUMBAR SPINE - COMPLETE 4+ VIEW COMPARISON:  None. FINDINGS: Diffuse degenerative disc and facet disease. Slight anterolisthesis at L4-5 and mild anterolisthesis of L5-S1. No fracture. SI joints symmetric and unremarkable. Aortic atherosclerosis. No visible aneurysm. IMPRESSION: Degenerative disc and facet disease as above. No acute bony abnormality. Aortic atherosclerosis. Electronically Signed   By: Rolm Baptise M.D.   On: 10/27/2018 08:16   Dg Hip Unilat W Or Wo Pelvis 2-3 Views Left  Result Date: 10/27/2018 CLINICAL DATA:  Fall, left hip pain EXAM: DG HIP (WITH OR WITHOUT PELVIS) 2-3V LEFT COMPARISON:  None. FINDINGS: Joint  space narrowing and spurring in the hip joints bilaterally. Fractures noted through the left inferior pubic ramus and possibly superior pubic ramus. No proximal femoral fracture, subluxation or dislocation. IMPRESSION: Left inferior pubic ramus and possibly superior pubic ramus fractures. Moderate degenerative changes in the hips bilaterally. Electronically Signed   By: Rolm Baptise M.D.   On: 10/27/2018 08:14    Procedures Procedures (including critical care time)  Medications Ordered in ED Medications  ketorolac (TORADOL) injection 60 mg (60 mg Intramuscular Given 10/27/18 0910)  acetaminophen (TYLENOL) tablet 1,000 mg (1,000 mg Oral Given 10/27/18 0910)     Initial Impression / Assessment and Plan / ED Course  I have reviewed the triage vital signs and the nursing notes.  Pertinent labs & imaging results that were available during my care of the patient were reviewed by me and considered in my medical decision making (see chart for details).        78yo female with history of memory problems, COPD, and diabetes presents with concern for fall.  Low suspicion for syncope by history from daughter and patient. EKG with sinus rhythm.  Offered CT  head/[place], however given pt denying head trauma, headache, exam without signs of trauma, no headache or neck pain, discussed low suspicion for injury and reasonable to continue to monitor symptoms and daughter and patient agree.  XR of lumbar spine and pelvis shows inferior pubic rami fracture and possible superior pubic rami fracture.  Recommend WBAT, given rx for walker, oxycodone, lidoderm patch. Reviewed in [place] drug database, discussed risks of medication and recommend primary tylenol/ibuprofen for pain.  Rec orthopedic and PCP follow up.Patient discharged in stable condition with understanding of reasons to return.   Final Clinical Impressions(s) / ED Diagnoses   Final diagnoses:  Fall, initial encounter  Inferior pubic ramus fracture, left, closed, initial encounter [facility])    ED Discharge Orders         Ordered    oxyCODONE (ROXICODONE) 5 MG immediate release tablet  Every 4 hours PRN     10/27/18 0849    lidocaine (LIDODERM) 5 %  Every 24 hours     10/27/18 0849    For home use only DME Walker     10/27/18 0850           Gareth Morgan, MD 10/27/18 2138    Gareth Morgan, MD 10/27/18 2139

## 2018-11-04 DIAGNOSIS — M25561 Pain in right knee: Secondary | ICD-10-CM | POA: Diagnosis not present

## 2018-11-04 DIAGNOSIS — G8929 Other chronic pain: Secondary | ICD-10-CM | POA: Diagnosis not present

## 2018-11-04 DIAGNOSIS — S32592A Other specified fracture of left pubis, initial encounter for closed fracture: Secondary | ICD-10-CM | POA: Diagnosis not present

## 2018-11-15 DIAGNOSIS — E119 Type 2 diabetes mellitus without complications: Secondary | ICD-10-CM | POA: Diagnosis not present

## 2018-11-15 DIAGNOSIS — G8929 Other chronic pain: Secondary | ICD-10-CM | POA: Diagnosis not present

## 2018-11-15 DIAGNOSIS — M25561 Pain in right knee: Secondary | ICD-10-CM | POA: Diagnosis not present

## 2018-11-18 DIAGNOSIS — E538 Deficiency of other specified B group vitamins: Secondary | ICD-10-CM | POA: Diagnosis not present

## 2018-11-18 DIAGNOSIS — N1832 Chronic kidney disease, stage 3b: Secondary | ICD-10-CM | POA: Diagnosis not present

## 2018-11-18 DIAGNOSIS — E1122 Type 2 diabetes mellitus with diabetic chronic kidney disease: Secondary | ICD-10-CM | POA: Diagnosis not present

## 2018-11-18 DIAGNOSIS — E039 Hypothyroidism, unspecified: Secondary | ICD-10-CM | POA: Diagnosis not present

## 2018-11-18 DIAGNOSIS — E782 Mixed hyperlipidemia: Secondary | ICD-10-CM | POA: Diagnosis not present

## 2018-11-18 DIAGNOSIS — N183 Chronic kidney disease, stage 3 unspecified: Secondary | ICD-10-CM | POA: Diagnosis not present

## 2018-11-18 DIAGNOSIS — I129 Hypertensive chronic kidney disease with stage 1 through stage 4 chronic kidney disease, or unspecified chronic kidney disease: Secondary | ICD-10-CM | POA: Diagnosis not present

## 2018-11-18 DIAGNOSIS — Z794 Long term (current) use of insulin: Secondary | ICD-10-CM | POA: Diagnosis not present

## 2018-11-18 DIAGNOSIS — E559 Vitamin D deficiency, unspecified: Secondary | ICD-10-CM | POA: Diagnosis not present

## 2018-11-25 DIAGNOSIS — Z794 Long term (current) use of insulin: Secondary | ICD-10-CM | POA: Diagnosis not present

## 2018-11-25 DIAGNOSIS — E039 Hypothyroidism, unspecified: Secondary | ICD-10-CM | POA: Diagnosis not present

## 2018-11-25 DIAGNOSIS — Z23 Encounter for immunization: Secondary | ICD-10-CM | POA: Diagnosis not present

## 2018-11-25 DIAGNOSIS — E538 Deficiency of other specified B group vitamins: Secondary | ICD-10-CM | POA: Diagnosis not present

## 2018-11-25 DIAGNOSIS — E1122 Type 2 diabetes mellitus with diabetic chronic kidney disease: Secondary | ICD-10-CM | POA: Diagnosis not present

## 2018-11-25 DIAGNOSIS — E559 Vitamin D deficiency, unspecified: Secondary | ICD-10-CM | POA: Diagnosis not present

## 2018-11-25 DIAGNOSIS — N1832 Chronic kidney disease, stage 3b: Secondary | ICD-10-CM | POA: Diagnosis not present

## 2018-11-25 DIAGNOSIS — E782 Mixed hyperlipidemia: Secondary | ICD-10-CM | POA: Diagnosis not present

## 2018-12-15 DIAGNOSIS — E119 Type 2 diabetes mellitus without complications: Secondary | ICD-10-CM | POA: Diagnosis not present

## 2018-12-15 DIAGNOSIS — M25561 Pain in right knee: Secondary | ICD-10-CM | POA: Diagnosis not present

## 2018-12-15 DIAGNOSIS — G8929 Other chronic pain: Secondary | ICD-10-CM | POA: Diagnosis not present

## 2018-12-16 DIAGNOSIS — S32592A Other specified fracture of left pubis, initial encounter for closed fracture: Secondary | ICD-10-CM | POA: Diagnosis not present

## 2019-01-14 ENCOUNTER — Encounter (HOSPITAL_BASED_OUTPATIENT_CLINIC_OR_DEPARTMENT_OTHER): Payer: Self-pay

## 2019-01-14 ENCOUNTER — Emergency Department (HOSPITAL_BASED_OUTPATIENT_CLINIC_OR_DEPARTMENT_OTHER): Payer: PPO

## 2019-01-14 ENCOUNTER — Other Ambulatory Visit: Payer: Self-pay

## 2019-01-14 ENCOUNTER — Emergency Department (HOSPITAL_BASED_OUTPATIENT_CLINIC_OR_DEPARTMENT_OTHER)
Admission: EM | Admit: 2019-01-14 | Discharge: 2019-01-14 | Disposition: A | Discharge status: Home / Self Care | Payer: PPO | Attending: Emergency Medicine | Admitting: Emergency Medicine

## 2019-01-14 DIAGNOSIS — M75122 Complete rotator cuff tear or rupture of left shoulder, not specified as traumatic: Secondary | ICD-10-CM | POA: Diagnosis not present

## 2019-01-14 DIAGNOSIS — R9431 Abnormal electrocardiogram [ECG] [EKG]: Secondary | ICD-10-CM | POA: Diagnosis not present

## 2019-01-14 DIAGNOSIS — Z79899 Other long term (current) drug therapy: Secondary | ICD-10-CM | POA: Diagnosis not present

## 2019-01-14 DIAGNOSIS — F17200 Nicotine dependence, unspecified, uncomplicated: Secondary | ICD-10-CM | POA: Insufficient documentation

## 2019-01-14 DIAGNOSIS — E119 Type 2 diabetes mellitus without complications: Secondary | ICD-10-CM | POA: Diagnosis not present

## 2019-01-14 DIAGNOSIS — M25412 Effusion, left shoulder: Secondary | ICD-10-CM

## 2019-01-14 DIAGNOSIS — M19012 Primary osteoarthritis, left shoulder: Secondary | ICD-10-CM | POA: Diagnosis not present

## 2019-01-14 DIAGNOSIS — J449 Chronic obstructive pulmonary disease, unspecified: Secondary | ICD-10-CM | POA: Diagnosis not present

## 2019-01-14 DIAGNOSIS — Z794 Long term (current) use of insulin: Secondary | ICD-10-CM | POA: Insufficient documentation

## 2019-01-14 DIAGNOSIS — M25512 Pain in left shoulder: Secondary | ICD-10-CM | POA: Diagnosis present

## 2019-01-14 HISTORY — DX: Other amnesia: R41.3

## 2019-01-14 LAB — CBC WITH DIFFERENTIAL/PLATELET
Abs Immature Granulocytes: 0.04 10*3/uL (ref 0.00–0.07)
Basophils Absolute: 0.1 10*3/uL (ref 0.0–0.1)
Basophils Relative: 0 %
Eosinophils Absolute: 0.1 10*3/uL (ref 0.0–0.5)
Eosinophils Relative: 0 %
HCT: 34.5 % — ABNORMAL LOW (ref 36.0–46.0)
Hemoglobin: 10.9 g/dL — ABNORMAL LOW (ref 12.0–15.0)
Immature Granulocytes: 0 %
Lymphocytes Relative: 10 %
Lymphs Abs: 1.1 10*3/uL (ref 0.7–4.0)
MCH: 28.5 pg (ref 26.0–34.0)
MCHC: 31.6 g/dL (ref 30.0–36.0)
MCV: 90.3 fL (ref 80.0–100.0)
Monocytes Absolute: 0.9 10*3/uL (ref 0.1–1.0)
Monocytes Relative: 8 %
Neutro Abs: 9.1 10*3/uL — ABNORMAL HIGH (ref 1.7–7.7)
Neutrophils Relative %: 82 %
Platelets: 217 10*3/uL (ref 150–400)
RBC: 3.82 MIL/uL — ABNORMAL LOW (ref 3.87–5.11)
RDW: 14.2 % (ref 11.5–15.5)
WBC: 11.2 10*3/uL — ABNORMAL HIGH (ref 4.0–10.5)
nRBC: 0 % (ref 0.0–0.2)

## 2019-01-14 LAB — BASIC METABOLIC PANEL
Anion gap: 9 (ref 5–15)
BUN: 38 mg/dL — ABNORMAL HIGH (ref 8–23)
CO2: 24 mmol/L (ref 22–32)
Calcium: 9.8 mg/dL (ref 8.9–10.3)
Chloride: 104 mmol/L (ref 98–111)
Creatinine, Ser: 1.4 mg/dL — ABNORMAL HIGH (ref 0.44–1.00)
GFR calc Af Amer: 42 mL/min — ABNORMAL LOW (ref >60)
GFR calc non Af Amer: 36 mL/min — ABNORMAL LOW (ref >60)
Glucose, Bld: 200 mg/dL — ABNORMAL HIGH (ref 70–99)
Potassium: 4.5 mmol/L (ref 3.5–5.1)
Sodium: 137 mmol/L (ref 135–145)

## 2019-01-14 LAB — C-REACTIVE PROTEIN: CRP: 0.9 mg/dL (ref <1.0)

## 2019-01-14 LAB — SEDIMENTATION RATE: Sed Rate: 1 mm/hr (ref 0–22)

## 2019-01-14 MED ORDER — IBUPROFEN 600 MG PO TABS: 600.0000 mg | ORAL_TABLET | Freq: Four times a day (QID) | ORAL | 0 refills | Status: DC | PRN

## 2019-01-14 MED ORDER — LIDOCAINE-EPINEPHRINE (PF) 2 %-1:200000 IJ SOLN
20.0000 mL | Freq: Once | INTRAMUSCULAR | Status: DC
  Filled 2019-01-14: qty 20

## 2019-01-14 NOTE — ED Notes (Signed)
Pt c/o left shoulder pain since yesterday. Denies injury. States pain worse with movement

## 2019-01-14 NOTE — Discharge Instructions (Signed)
As we discussed you have a large fluid collection in your shoulder.  You declined removal of the fluid from your shoulder joint today.  You understand that infection cannot be ruled out completely without doing this test.  Follow-up with your orthopedic doctor on Monday and take the anti-inflammatory as prescribed.  Return to the ED with worsening pain, fever, vomiting, any other concerns.

## 2019-01-14 NOTE — ED Notes (Signed)
Patient transported to MRI 

## 2019-01-14 NOTE — ED Provider Notes (Signed)
[facility] EMERGENCY DEPARTMENT Provider Note   CSN: [id number] Arrival date & time: 01/14/19  1251     History Chief Complaint  Patient presents with   Shoulder Pain    Lisa Nelson is a 79 y.o. female.  Patient with history of COPD, diabetes, previous left pelvic fracture here with left shoulder pain for the past 2 days.  Denies fall or trauma.  Daughter has noticed some swelling and deformity to the left shoulder patient denies any fall or injury.  There is pain with range of motion.  She was taking some over-the-counter anti-inflammatories without relief.  There is no fever, chills, nausea, vomiting.  No redness of the joint.  No other joint pain.  Has had issues with the shoulder in the past but not required surgery.  The pain is mostly in the anterior lateral shoulder and worse with palpation and movement.  There is no chest pain or shortness of breath.  No abdominal pain, nausea or vomiting.  The history is provided by the patient.  Shoulder Pain Associated symptoms: no fever        Past Medical History:  Diagnosis Date   COPD (chronic obstructive pulmonary disease) ([place])    Diabetes mellitus without complication ([place])    Hip fracture ([place]) 10/13/2018   L hip   Memory deficit     There are no problems to display for this patient.   Past Surgical History:  Procedure Laterality Date   ABDOMINAL HYSTERECTOMY       OB History   No obstetric history on file.     No family history on file.  Social History   Tobacco Use   Smoking status: Former Smoker   Smokeless tobacco: Never Used  Substance Use Topics   Alcohol use: Not on file   Drug use: Not on file    Home Medications Prior to Admission medications   Medication Sig Start Date End Date Taking? Authorizing Provider  atorvastatin (LIPITOR) 20 MG tablet Take by mouth. 01/26/18   [provider]  Cholecalciferol (VITAMIN D-1000 MAX ST) 25 MCG (1000 UT) tablet Take by  mouth.    [provider]  donepezil (ARICEPT) 10 MG tablet TAKE ONE TABLET BY MOUTH  EVERY EVENING 09/13/15   [provider]  hydrOXYzine (ATARAX/VISTARIL) 25 MG tablet 1/2 or 1 tab prn anxiety 07/29/18   [provider]  insulin NPH Human (NOVOLIN N) 100 UNIT/ML injection Inject up to 25 units [place] once daily 10/02/15   [provider]  levothyroxine (SYNTHROID) 88 MCG tablet Take by mouth. 06/22/18   [provider]  lidocaine (LIDODERM) 5 % Place 1 patch onto the skin daily. Remove & Discard patch within 12 hours or as directed by MD 10/27/18   Gareth Morgan, MD  lisinopril (ZESTRIL) 20 MG tablet Take by mouth. 01/26/18   [provider]  memantine (NAMENDA) 10 MG tablet Take by mouth. 07/29/18   [provider]  metoprolol tartrate (LOPRESSOR) 25 MG tablet Take 1/2 to 1 tablet daily 03/18/18   [provider]  oxyCODONE (ROXICODONE) 5 MG immediate release tablet Take 1 tablet (5 mg total) by mouth every 4 (four) hours as needed for severe pain. 10/27/18   Gareth Morgan, MD  QUEtiapine (SEROQUEL) 25 MG tablet Take by mouth. 12/17/17   [provider]    Allergies    Statins  Review of Systems   Review of Systems  Constitutional: Negative for activity change, appetite change and  fever.  HENT: Negative for congestion and rhinorrhea.   Respiratory: Negative for cough and shortness of breath.   Cardiovascular: Negative for chest pain.  Gastrointestinal: Negative for abdominal pain, nausea and vomiting.  Genitourinary: Negative for dysuria and hematuria.  Musculoskeletal: Positive for arthralgias and myalgias.  Skin: Negative for rash.  Neurological: Negative for dizziness, weakness and headaches.   all other systems are negative except as noted in the HPI and PMH.    Physical Exam Updated Vital Signs BP (!) 145/85 (BP Location: Left Arm)    Pulse 90    Temp 99.3 F (37.4 C) (Oral)    Resp 20    Ht '5\' 4"'$   (1.626 m)    Wt 59.9 kg    SpO2 100%    BMI 22.66 kg/m   Physical Exam Vitals and nursing note reviewed.  Constitutional:      General: She is not in acute distress.    Appearance: She is well-developed.  HENT:     Head: Normocephalic and atraumatic.     Mouth/Throat:     Pharynx: No oropharyngeal exudate.  Eyes:     Conjunctiva/sclera: Conjunctivae normal.     Pupils: Pupils are equal, round, and reactive to light.  Neck:     Comments: No meningismus. Cardiovascular:     Rate and Rhythm: Normal rate and regular rhythm.     Heart sounds: Normal heart sounds. No murmur.  Pulmonary:     Effort: Pulmonary effort is normal. No respiratory distress.     Breath sounds: Normal breath sounds.  Abdominal:     Palpations: Abdomen is soft.     Tenderness: There is no abdominal tenderness. There is no guarding or rebound.  Musculoskeletal:        General: Swelling and tenderness present. No deformity. Normal range of motion.     Cervical back: Normal range of motion and neck supple.     Comments: There is asymmetric swelling of the left shoulder compared to the right.  There is no significant erythema or warmth.  Patient is able to range shoulder >90  degrees, touch opposite shoulder including arm behind her head without difficulty.  Abduction, flexion and extension intact with some discomfort Intact radial pulse and cardinal hand movements.  Joint is not hot.  Skin:    General: Skin is warm.     Capillary Refill: Capillary refill takes less than 2 seconds.  Neurological:     General: No focal deficit present.     Mental Status: She is alert and oriented to person, place, and time. Mental status is at baseline.     Cranial Nerves: No cranial nerve deficit.     Motor: No abnormal muscle tone.     Coordination: Coordination normal.     Comments:  5/5 strength throughout. CN 2-12 intact.Equal grip strength.   Psychiatric:        Behavior: Behavior normal.     ED Results / Procedures  / Treatments   Labs (all labs ordered are listed, but only abnormal results are displayed) Labs Reviewed  CBC WITH DIFFERENTIAL/PLATELET - Abnormal; Notable for the following components:      Result Value   WBC 11.2 (*)    RBC 3.82 (*)    Hemoglobin 10.9 (*)    HCT 34.5 (*)    Neutro Abs 9.1 (*)    All other components within normal limits  BASIC METABOLIC PANEL - Abnormal; Notable for the following components:   Glucose, Bld 200 (*)  BUN 38 (*)    Creatinine, Ser 1.40 (*)    GFR calc non Af Amer 36 (*)    GFR calc Af Amer 42 (*)    All other components within normal limits  CULTURE, BLOOD (ROUTINE X 2)  CULTURE, BLOOD (ROUTINE X 2)  SEDIMENTATION RATE  C-REACTIVE PROTEIN    EKG None  Radiology DG Shoulder Left  Result Date: 01/14/2019 CLINICAL DATA:  Left shoulder pain and edema.  No known injury. EXAM: LEFT SHOULDER - 2+ VIEW COMPARISON:  None. FINDINGS: No fracture or dislocation. Mild degenerative change of the glenohumeral joint with joint space loss and subchondral sclerosis. Mild degenerative change of the [facility] joint with joint space loss, subchondral sclerosis and inferiorly directed osteophytosis. Apparent soft tissue fullness about the humeral head. No evidence calcific tendinitis. Vascular calcifications overlies the left axilla. Limited visualization of the adjacent thorax demonstrates atherosclerotic plaque within the thoracic aorta. IMPRESSION: 1. Apparent soft tissue fullness about the humeral head could be artifactual though conceivably a joint effusion could have a similar appearance. Clinical correlation is advised. Further evaluation with MRI could be performed as indicated. 2. Mild degenerative change of the glenohumeral and acromioclavicular joints. 3.  Aortic Atherosclerosis (ICD10-I70.0). Electronically Signed   By: Sandi Mariscal M.D.   On: 01/14/2019 14:58    Procedures Procedures (including critical care time)  Medications Ordered in ED Medications - No  data to display  ED Course  I have reviewed the triage vital signs and the nursing notes.  Pertinent labs & imaging results that were available during my care of the patient were reviewed by me and considered in my medical decision making (see chart for details).    MDM Rules/Calculators/A&P                     Atraumatic left shoulder pain.  Neurovascular intact.  X-ray shows no fracture but does show soft tissue fullness of the humeral head.    Temperature 99.2 on arrival.  Patient able to range joint without difficulty.  Low suspicion for septic joint.  Afebrile.  White blood cell count is 11.  Will obtain ESR, CRP and blood cultures.  MRI is available so we will proceed with additional imaging to further evaluate x-ray abnormality. ESR and CRP normal.   Dr. Liborio Nixon has discussed MRI.  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  Results discussed with patient.  Offered arthrocentesis to further assess effusion. May help her pain and give diagnosis  Risk and benefits discussed.  Low suspicion for septic joint as patient has no fever, no leukocytosis normal sed rate and normal CRP and good range of motion of the shoulder.  However discussed that cannot rule out septic joint completely without doing arthrocentesis.  Patient and daughter declined arthrocentesis at this time and understands septic joint cannot be ruled out completely.  Will treat with shoulder immobilizer and antiinflammatories. Patient to call her orthopedic doctor on Monday. Return to the ED sooner with worsening pain, fever, weakness, numbness, or if she changes her mind about arthrocentesis. Final Clinical Impression(s) / ED Diagnoses Final diagnoses:  Effusion of joint of left shoulder    Rx / DC Orders ED Discharge Orders    None       Leda Belle[place]uille, Annie Main, MD 01/15/19 0128

## 2019-01-14 NOTE — ED Triage Notes (Signed)
Pt c/o sharp L shoulder pain with radiation down the arm that started last PM with obvious swelling hurts with movement.

## 2019-01-14 NOTE — ED Notes (Signed)
ED Provider at bedside. 

## 2019-01-19 DIAGNOSIS — M25512 Pain in left shoulder: Secondary | ICD-10-CM | POA: Diagnosis not present

## 2019-01-19 DIAGNOSIS — K861 Other chronic pancreatitis: Secondary | ICD-10-CM | POA: Diagnosis not present

## 2019-01-19 DIAGNOSIS — R413 Other amnesia: Secondary | ICD-10-CM | POA: Diagnosis not present

## 2019-01-19 DIAGNOSIS — G301 Alzheimer's disease with late onset: Secondary | ICD-10-CM | POA: Diagnosis not present

## 2019-01-19 DIAGNOSIS — F0280 Dementia in other diseases classified elsewhere without behavioral disturbance: Secondary | ICD-10-CM | POA: Diagnosis not present

## 2019-01-19 DIAGNOSIS — R4586 Emotional lability: Secondary | ICD-10-CM | POA: Diagnosis not present

## 2019-01-19 DIAGNOSIS — Z794 Long term (current) use of insulin: Secondary | ICD-10-CM | POA: Diagnosis not present

## 2019-01-19 DIAGNOSIS — J441 Chronic obstructive pulmonary disease with (acute) exacerbation: Secondary | ICD-10-CM | POA: Diagnosis not present

## 2019-01-19 DIAGNOSIS — N1831 Chronic kidney disease, stage 3a: Secondary | ICD-10-CM | POA: Diagnosis not present

## 2019-01-19 DIAGNOSIS — E113291 Type 2 diabetes mellitus with mild nonproliferative diabetic retinopathy without macular edema, right eye: Secondary | ICD-10-CM | POA: Diagnosis not present

## 2019-01-19 DIAGNOSIS — Z0000 Encounter for general adult medical examination without abnormal findings: Secondary | ICD-10-CM | POA: Diagnosis not present

## 2019-01-19 DIAGNOSIS — E1142 Type 2 diabetes mellitus with diabetic polyneuropathy: Secondary | ICD-10-CM | POA: Diagnosis not present

## 2019-01-19 DIAGNOSIS — E1021 Type 1 diabetes mellitus with diabetic nephropathy: Secondary | ICD-10-CM | POA: Diagnosis not present

## 2019-01-19 LAB — CULTURE, BLOOD (ROUTINE X 2)
Culture: NO GROWTH
Culture: NO GROWTH
Special Requests: ADEQUATE

## 2019-03-07 DIAGNOSIS — G301 Alzheimer's disease with late onset: Secondary | ICD-10-CM | POA: Diagnosis not present

## 2019-03-07 DIAGNOSIS — F0281 Dementia in other diseases classified elsewhere with behavioral disturbance: Secondary | ICD-10-CM | POA: Diagnosis not present

## 2019-03-17 DIAGNOSIS — R4586 Emotional lability: Secondary | ICD-10-CM | POA: Diagnosis not present

## 2019-03-17 DIAGNOSIS — R413 Other amnesia: Secondary | ICD-10-CM | POA: Diagnosis not present

## 2019-03-17 DIAGNOSIS — G308 Other Alzheimer's disease: Secondary | ICD-10-CM | POA: Diagnosis not present

## 2019-03-17 DIAGNOSIS — F0281 Dementia in other diseases classified elsewhere with behavioral disturbance: Secondary | ICD-10-CM | POA: Diagnosis not present

## 2019-04-07 DIAGNOSIS — G308 Other Alzheimer's disease: Secondary | ICD-10-CM | POA: Diagnosis not present

## 2019-04-07 DIAGNOSIS — R413 Other amnesia: Secondary | ICD-10-CM | POA: Diagnosis not present

## 2019-04-07 DIAGNOSIS — F0281 Dementia in other diseases classified elsewhere with behavioral disturbance: Secondary | ICD-10-CM | POA: Diagnosis not present

## 2019-04-28 DIAGNOSIS — E039 Hypothyroidism, unspecified: Secondary | ICD-10-CM | POA: Diagnosis not present

## 2019-04-28 DIAGNOSIS — E1122 Type 2 diabetes mellitus with diabetic chronic kidney disease: Secondary | ICD-10-CM | POA: Diagnosis not present

## 2019-04-28 DIAGNOSIS — Z794 Long term (current) use of insulin: Secondary | ICD-10-CM | POA: Diagnosis not present

## 2019-04-28 DIAGNOSIS — I129 Hypertensive chronic kidney disease with stage 1 through stage 4 chronic kidney disease, or unspecified chronic kidney disease: Secondary | ICD-10-CM | POA: Diagnosis not present

## 2019-04-28 DIAGNOSIS — N183 Chronic kidney disease, stage 3 unspecified: Secondary | ICD-10-CM | POA: Diagnosis not present

## 2019-05-05 DIAGNOSIS — F0281 Dementia in other diseases classified elsewhere with behavioral disturbance: Secondary | ICD-10-CM | POA: Diagnosis not present

## 2019-05-05 DIAGNOSIS — G308 Other Alzheimer's disease: Secondary | ICD-10-CM | POA: Diagnosis not present

## 2019-05-19 DIAGNOSIS — G308 Other Alzheimer's disease: Secondary | ICD-10-CM | POA: Diagnosis not present

## 2019-05-19 DIAGNOSIS — N183 Chronic kidney disease, stage 3 unspecified: Secondary | ICD-10-CM | POA: Diagnosis not present

## 2019-05-19 DIAGNOSIS — I129 Hypertensive chronic kidney disease with stage 1 through stage 4 chronic kidney disease, or unspecified chronic kidney disease: Secondary | ICD-10-CM | POA: Diagnosis not present

## 2019-05-19 DIAGNOSIS — Z794 Long term (current) use of insulin: Secondary | ICD-10-CM | POA: Diagnosis not present

## 2019-05-19 DIAGNOSIS — E1122 Type 2 diabetes mellitus with diabetic chronic kidney disease: Secondary | ICD-10-CM | POA: Diagnosis not present

## 2019-05-19 DIAGNOSIS — J441 Chronic obstructive pulmonary disease with (acute) exacerbation: Secondary | ICD-10-CM | POA: Diagnosis not present

## 2019-05-19 DIAGNOSIS — F0281 Dementia in other diseases classified elsewhere with behavioral disturbance: Secondary | ICD-10-CM | POA: Diagnosis not present

## 2019-05-19 DIAGNOSIS — N1832 Chronic kidney disease, stage 3b: Secondary | ICD-10-CM | POA: Diagnosis not present

## 2019-05-19 DIAGNOSIS — N1831 Chronic kidney disease, stage 3a: Secondary | ICD-10-CM | POA: Diagnosis not present

## 2019-06-30 DIAGNOSIS — R5383 Other fatigue: Secondary | ICD-10-CM | POA: Diagnosis not present

## 2019-07-07 DIAGNOSIS — F0281 Dementia in other diseases classified elsewhere with behavioral disturbance: Secondary | ICD-10-CM | POA: Diagnosis not present

## 2019-07-07 DIAGNOSIS — G301 Alzheimer's disease with late onset: Secondary | ICD-10-CM | POA: Diagnosis not present

## 2019-08-10 DIAGNOSIS — R05 Cough: Secondary | ICD-10-CM | POA: Diagnosis not present

## 2019-08-10 DIAGNOSIS — S2231XA Fracture of one rib, right side, initial encounter for closed fracture: Secondary | ICD-10-CM | POA: Diagnosis not present

## 2019-08-10 DIAGNOSIS — R0602 Shortness of breath: Secondary | ICD-10-CM | POA: Diagnosis not present

## 2019-08-10 DIAGNOSIS — J189 Pneumonia, unspecified organism: Secondary | ICD-10-CM | POA: Diagnosis not present

## 2019-09-29 DIAGNOSIS — G308 Other Alzheimer's disease: Secondary | ICD-10-CM | POA: Diagnosis not present

## 2019-09-29 DIAGNOSIS — R4586 Emotional lability: Secondary | ICD-10-CM | POA: Diagnosis not present

## 2019-09-29 DIAGNOSIS — F0281 Dementia in other diseases classified elsewhere with behavioral disturbance: Secondary | ICD-10-CM | POA: Diagnosis not present

## 2019-09-29 DIAGNOSIS — F413 Other mixed anxiety disorders: Secondary | ICD-10-CM | POA: Diagnosis not present

## 2019-10-06 DIAGNOSIS — M25552 Pain in left hip: Secondary | ICD-10-CM | POA: Diagnosis not present

## 2019-10-24 DIAGNOSIS — M25551 Pain in right hip: Secondary | ICD-10-CM | POA: Diagnosis not present

## 2019-10-24 DIAGNOSIS — Z7409 Other reduced mobility: Secondary | ICD-10-CM | POA: Diagnosis not present

## 2019-10-24 DIAGNOSIS — Z789 Other specified health status: Secondary | ICD-10-CM | POA: Diagnosis not present

## 2019-10-24 DIAGNOSIS — R29898 Other symptoms and signs involving the musculoskeletal system: Secondary | ICD-10-CM | POA: Diagnosis not present

## 2019-10-24 DIAGNOSIS — M7062 Trochanteric bursitis, left hip: Secondary | ICD-10-CM | POA: Diagnosis not present

## 2019-11-01 DIAGNOSIS — M7062 Trochanteric bursitis, left hip: Secondary | ICD-10-CM | POA: Diagnosis not present

## 2019-11-01 DIAGNOSIS — Z789 Other specified health status: Secondary | ICD-10-CM | POA: Diagnosis not present

## 2019-11-01 DIAGNOSIS — R29898 Other symptoms and signs involving the musculoskeletal system: Secondary | ICD-10-CM | POA: Diagnosis not present

## 2019-11-01 DIAGNOSIS — M25551 Pain in right hip: Secondary | ICD-10-CM | POA: Diagnosis not present

## 2019-11-01 DIAGNOSIS — Z7409 Other reduced mobility: Secondary | ICD-10-CM | POA: Diagnosis not present

## 2019-11-03 DIAGNOSIS — E1142 Type 2 diabetes mellitus with diabetic polyneuropathy: Secondary | ICD-10-CM | POA: Diagnosis not present

## 2019-11-03 DIAGNOSIS — E039 Hypothyroidism, unspecified: Secondary | ICD-10-CM | POA: Diagnosis not present

## 2019-11-03 DIAGNOSIS — Z794 Long term (current) use of insulin: Secondary | ICD-10-CM | POA: Diagnosis not present

## 2019-11-03 DIAGNOSIS — I10 Essential (primary) hypertension: Secondary | ICD-10-CM | POA: Diagnosis not present

## 2019-11-09 DIAGNOSIS — M25551 Pain in right hip: Secondary | ICD-10-CM | POA: Diagnosis not present

## 2019-11-09 DIAGNOSIS — R29898 Other symptoms and signs involving the musculoskeletal system: Secondary | ICD-10-CM | POA: Diagnosis not present

## 2019-11-09 DIAGNOSIS — Z7409 Other reduced mobility: Secondary | ICD-10-CM | POA: Diagnosis not present

## 2019-11-09 DIAGNOSIS — M7062 Trochanteric bursitis, left hip: Secondary | ICD-10-CM | POA: Diagnosis not present

## 2019-11-09 DIAGNOSIS — Z789 Other specified health status: Secondary | ICD-10-CM | POA: Diagnosis not present

## 2019-11-10 DIAGNOSIS — F0281 Dementia in other diseases classified elsewhere with behavioral disturbance: Secondary | ICD-10-CM | POA: Diagnosis not present

## 2019-11-10 DIAGNOSIS — G301 Alzheimer's disease with late onset: Secondary | ICD-10-CM | POA: Diagnosis not present

## 2019-11-16 DIAGNOSIS — M7062 Trochanteric bursitis, left hip: Secondary | ICD-10-CM | POA: Diagnosis not present

## 2019-11-16 DIAGNOSIS — Z789 Other specified health status: Secondary | ICD-10-CM | POA: Diagnosis not present

## 2019-11-16 DIAGNOSIS — R29898 Other symptoms and signs involving the musculoskeletal system: Secondary | ICD-10-CM | POA: Diagnosis not present

## 2019-11-16 DIAGNOSIS — M25551 Pain in right hip: Secondary | ICD-10-CM | POA: Diagnosis not present

## 2019-11-16 DIAGNOSIS — Z7409 Other reduced mobility: Secondary | ICD-10-CM | POA: Diagnosis not present

## 2019-11-23 DIAGNOSIS — Z7409 Other reduced mobility: Secondary | ICD-10-CM | POA: Diagnosis not present

## 2019-11-23 DIAGNOSIS — M25551 Pain in right hip: Secondary | ICD-10-CM | POA: Diagnosis not present

## 2019-11-23 DIAGNOSIS — R29898 Other symptoms and signs involving the musculoskeletal system: Secondary | ICD-10-CM | POA: Diagnosis not present

## 2019-11-23 DIAGNOSIS — Z789 Other specified health status: Secondary | ICD-10-CM | POA: Diagnosis not present

## 2019-11-23 DIAGNOSIS — M7062 Trochanteric bursitis, left hip: Secondary | ICD-10-CM | POA: Diagnosis not present

## 2019-11-28 ENCOUNTER — Emergency Department (HOSPITAL_BASED_OUTPATIENT_CLINIC_OR_DEPARTMENT_OTHER)
Admission: EM | Admit: 2019-11-28 | Discharge: 2019-11-28 | Disposition: A | Discharge status: Home / Self Care | Payer: PPO | Attending: Emergency Medicine | Admitting: Emergency Medicine

## 2019-11-28 ENCOUNTER — Encounter (HOSPITAL_BASED_OUTPATIENT_CLINIC_OR_DEPARTMENT_OTHER): Payer: Self-pay | Admitting: Emergency Medicine

## 2019-11-28 ENCOUNTER — Emergency Department (HOSPITAL_BASED_OUTPATIENT_CLINIC_OR_DEPARTMENT_OTHER): Payer: PPO

## 2019-11-28 ENCOUNTER — Other Ambulatory Visit: Payer: Self-pay

## 2019-11-28 DIAGNOSIS — Z87891 Personal history of nicotine dependence: Secondary | ICD-10-CM | POA: Insufficient documentation

## 2019-11-28 DIAGNOSIS — M546 Pain in thoracic spine: Secondary | ICD-10-CM | POA: Diagnosis not present

## 2019-11-28 DIAGNOSIS — R079 Chest pain, unspecified: Secondary | ICD-10-CM | POA: Insufficient documentation

## 2019-11-28 DIAGNOSIS — Z794 Long term (current) use of insulin: Secondary | ICD-10-CM | POA: Insufficient documentation

## 2019-11-28 DIAGNOSIS — R0789 Other chest pain: Secondary | ICD-10-CM | POA: Diagnosis not present

## 2019-11-28 DIAGNOSIS — J449 Chronic obstructive pulmonary disease, unspecified: Secondary | ICD-10-CM | POA: Diagnosis not present

## 2019-11-28 DIAGNOSIS — R531 Weakness: Secondary | ICD-10-CM | POA: Diagnosis not present

## 2019-11-28 DIAGNOSIS — J90 Pleural effusion, not elsewhere classified: Secondary | ICD-10-CM | POA: Diagnosis not present

## 2019-11-28 DIAGNOSIS — J45909 Unspecified asthma, uncomplicated: Secondary | ICD-10-CM | POA: Diagnosis not present

## 2019-11-28 LAB — COMPREHENSIVE METABOLIC PANEL
ALT: 15 U/L (ref 0–44)
AST: 20 U/L (ref 15–41)
Albumin: 3.6 g/dL (ref 3.5–5.0)
Alkaline Phosphatase: 81 U/L (ref 38–126)
Anion gap: 9 (ref 5–15)
BUN: 55 mg/dL — ABNORMAL HIGH (ref 8–23)
CO2: 17 mmol/L — ABNORMAL LOW (ref 22–32)
Calcium: 9.3 mg/dL (ref 8.9–10.3)
Chloride: 110 mmol/L (ref 98–111)
Creatinine, Ser: 1.41 mg/dL — ABNORMAL HIGH (ref 0.44–1.00)
GFR, Estimated: 38 mL/min — ABNORMAL LOW (ref >60)
Glucose, Bld: 158 mg/dL — ABNORMAL HIGH (ref 70–99)
Potassium: 5.1 mmol/L (ref 3.5–5.1)
Sodium: 136 mmol/L (ref 135–145)
Total Bilirubin: 0.6 mg/dL (ref 0.3–1.2)
Total Protein: 7.3 g/dL (ref 6.5–8.1)

## 2019-11-28 LAB — CBC WITH DIFFERENTIAL/PLATELET
Abs Immature Granulocytes: 0.04 10*3/uL (ref 0.00–0.07)
Basophils Absolute: 0.1 10*3/uL (ref 0.0–0.1)
Basophils Relative: 1 %
Eosinophils Absolute: 0.3 10*3/uL (ref 0.0–0.5)
Eosinophils Relative: 3 %
HCT: 35.6 % — ABNORMAL LOW (ref 36.0–46.0)
Hemoglobin: 11.3 g/dL — ABNORMAL LOW (ref 12.0–15.0)
Immature Granulocytes: 0 %
Lymphocytes Relative: 18 %
Lymphs Abs: 1.8 10*3/uL (ref 0.7–4.0)
MCH: 28.9 pg (ref 26.0–34.0)
MCHC: 31.7 g/dL (ref 30.0–36.0)
MCV: 91 fL (ref 80.0–100.0)
Monocytes Absolute: 0.7 10*3/uL (ref 0.1–1.0)
Monocytes Relative: 7 %
Neutro Abs: 6.8 10*3/uL (ref 1.7–7.7)
Neutrophils Relative %: 71 %
Platelets: 232 10*3/uL (ref 150–400)
RBC: 3.91 MIL/uL (ref 3.87–5.11)
RDW: 13.9 % (ref 11.5–15.5)
WBC: 9.7 10*3/uL (ref 4.0–10.5)
nRBC: 0 % (ref 0.0–0.2)

## 2019-11-28 LAB — LIPASE, BLOOD: Lipase: 37 U/L (ref 11–51)

## 2019-11-28 LAB — TROPONIN I (HIGH SENSITIVITY)
Troponin I (High Sensitivity): 13 ng/L (ref <18)
Troponin I (High Sensitivity): 11 ng/L (ref <18)

## 2019-11-28 LAB — CBG MONITORING, ED: Glucose-Capillary: 212 mg/dL — ABNORMAL HIGH (ref 70–99)

## 2019-11-28 NOTE — ED Provider Notes (Signed)
[facility] Provider Note   CSN: [id number] Arrival date & time: 11/28/19  9211     History Chief Complaint  Patient presents with  . Chest Pain    Lisa Nelson is a 79 y.o. female.  HPI Level 5 caveat due to dementia. Patient presents with reported chest pain.  Began this morning.  Complaining of pain in her chest and upper back.  Feeling better now.  It is unusual for her to complain of chest pain.  Did not want to even eat her breakfast.  Reported looked a little pale and had some nausea.  Patient does not remember the episode and really cannot provide much of the history.  No fevers.  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 COPD (chronic obstructive pulmonary disease) ([place])   . Diabetes mellitus without complication ([place])   . Hip fracture ([place]) 10/13/2018   L hip  . Memory deficit     There are no problems to display for this patient.   Past Surgical History:  Procedure Laterality Date  . ABDOMINAL HYSTERECTOMY       OB History   No obstetric history on file.     History reviewed. No pertinent family history.  Social History   Tobacco Use  . Smoking status: Former Research scientist (life sciences)  . Smokeless tobacco: Never Used  Substance Use Topics  . Alcohol use: Not Currently  . Drug use: Never    Home Medications Prior to Admission medications   Medication Sig Start Date End Date Taking? Authorizing Provider  atorvastatin (LIPITOR) 20 MG tablet Take by mouth. 01/26/18   [provider]  Cholecalciferol (VITAMIN D-1000 MAX ST) 25 MCG (1000 UT) tablet Take by mouth.    [provider]  donepezil (ARICEPT) 10 MG tablet TAKE ONE TABLET BY MOUTH  EVERY EVENING 09/13/15   [provider]  hydrOXYzine (ATARAX/VISTARIL) 25 MG tablet 1/2 or 1 tab prn anxiety 07/29/18   [provider]  ibuprofen (ADVIL) 600 MG tablet Take 1 tablet (600 mg total) by mouth  every 6 (six) hours as needed. 01/14/19   Rancour, Annie Main, MD  insulin NPH Human (NOVOLIN N) 100 UNIT/ML injection Inject up to 25 units [place] once daily 10/02/15   [provider]  levothyroxine (SYNTHROID) 88 MCG tablet Take by mouth. 06/22/18   [provider]  lidocaine (LIDODERM) 5 % Place 1 patch onto the skin daily. Remove & Discard patch within 12 hours or as directed by MD 10/27/18   Gareth Morgan, MD  lisinopril (ZESTRIL) 20 MG tablet Take by mouth. 01/26/18   [provider]  memantine (NAMENDA) 10 MG tablet Take by mouth. 07/29/18   [provider]  metoprolol tartrate (LOPRESSOR) 25 MG tablet Take 1/2 to 1 tablet daily 03/18/18   [provider]  oxyCODONE (ROXICODONE) 5 MG immediate release tablet Take 1 tablet (5 mg total) by mouth every 4 (four) hours as needed for severe pain. 10/27/18   Gareth Morgan, MD  QUEtiapine (SEROQUEL) 25 MG tablet Take by mouth. 12/17/17   [provider]    Allergies    Statins  Review of Systems   Review of Systems  Unable to perform ROS: Dementia    Physical Exam Updated Vital Signs BP 127/60   Pulse 74   Temp (!) 97.5 F (36.4 C) (Oral)   Resp (!) 21   Ht 5\' 4"  (1.626 m)  Wt 59 kg   SpO2 100%   BMI 22.31 kg/m   Physical Exam Vitals and nursing note reviewed.  Constitutional:      Appearance: She is well-developed.  HENT:     Head: Atraumatic.  Cardiovascular:     Rate and Rhythm: Normal rate and regular rhythm.  Pulmonary:     Breath sounds: No wheezing, rhonchi or rales.  Chest:     Chest wall: No tenderness.  Abdominal:     Tenderness: There is no abdominal tenderness.  Musculoskeletal:     Cervical back: Neck supple.     Right lower leg: No tenderness. No edema.     Left lower leg: No tenderness. No edema.  Skin:    General: Skin is warm.     Capillary Refill: Capillary refill takes less than 2 seconds.  Neurological:     Mental Status: She is alert.      Comments: Awake and pleasant and reported at baseline but does have some confusion.     ED Results / Procedures / Treatments   Labs (all labs ordered are listed, but only abnormal results are displayed) Labs Reviewed  COMPREHENSIVE METABOLIC PANEL - Abnormal; Notable for the following components:      Result Value   CO2 17 (*)    Glucose, Bld 158 (*)    BUN 55 (*)    Creatinine, Ser 1.41 (*)    GFR, Estimated 38 (*)    All other components within normal limits  CBC WITH DIFFERENTIAL/PLATELET - Abnormal; Notable for the following components:   Hemoglobin 11.3 (*)    HCT 35.6 (*)    All other components within normal limits  CBG MONITORING, ED - Abnormal; Notable for the following components:   Glucose-Capillary 212 (*)    All other components within normal limits  LIPASE, BLOOD  TROPONIN I (HIGH SENSITIVITY)  TROPONIN I (HIGH SENSITIVITY)    EKG EKG Interpretation  Date/Time:  Tuesday November 28 2019 08:35:57 EST Ventricular Rate:  67 PR Interval:    QRS Duration: 101 QT Interval:  424 QTC Calculation: 448 R Axis:   -25 Text Interpretation: Sinus rhythm Borderline left axis deviation Nonspecific T abnormalities, lateral leads Confirmed by Davonna Belling [phone]) on 11/28/2019 9:28:20 AM   Radiology DG Chest Portable 1 View  Result Date: 11/28/2019 CLINICAL DATA:  Chest pain. Chest pain and UPPER back pain. Nausea, weakness. EXAM: PORTABLE CHEST 1 VIEW COMPARISON:  08/10/2019 FINDINGS: Heart size is normal. Lungs are free of focal consolidations and pleural effusions. No pulmonary edema. Remote RIGHT rib fractures. IMPRESSION: Negative. Electronically Signed   By: Nolon Nations M.D.   On: 11/28/2019 09:22    Procedures Procedures (including critical care time)  Medications Ordered in ED Medications - No data to display  ED Course  I have reviewed the triage vital signs and the nursing notes.  Pertinent labs & imaging results that were available during my  care of the patient were reviewed by me and considered in my medical decision making (see chart for details).    MDM Rules/Calculators/A&P                          Patient had episode of back pain that moved to the chest.  Happened acutely and then resolved quickly also.  Quickly resolved and was back to baseline shortly after the event.  Reportedly looked diaphoretic during the episode.  EKG reassuring.  Troponin negative x2.  Doubt  cardiac cause.  Other acute pathology such as pulmonary embolism or aortic dissection felt less likely since the quick resolution of the pain.  Well-appearing.  Will discharge home with outpatient follow-up as needed.  I have reviewed EKGs imaging and blood work Final Clinical Impression(s) / ED Diagnoses Final diagnoses:  Acute thoracic back pain, unspecified back pain laterality  Nonspecific chest pain    Rx / DC Orders ED Discharge Orders    None       Davonna Belling, MD 11/28/19 1256

## 2019-11-28 NOTE — Discharge Instructions (Signed)
Follow-up with your doctors as needed. 

## 2019-11-28 NOTE — ED Triage Notes (Signed)
Pt arrives with daughter POV. Daughter reports pt has alzheimers, c/o acute CP with upper back pain, Nausea and weakness

## 2019-11-30 DIAGNOSIS — M25551 Pain in right hip: Secondary | ICD-10-CM | POA: Diagnosis not present

## 2019-11-30 DIAGNOSIS — Z789 Other specified health status: Secondary | ICD-10-CM | POA: Diagnosis not present

## 2019-11-30 DIAGNOSIS — Z7409 Other reduced mobility: Secondary | ICD-10-CM | POA: Diagnosis not present

## 2019-11-30 DIAGNOSIS — M7062 Trochanteric bursitis, left hip: Secondary | ICD-10-CM | POA: Diagnosis not present

## 2019-11-30 DIAGNOSIS — R29898 Other symptoms and signs involving the musculoskeletal system: Secondary | ICD-10-CM | POA: Diagnosis not present

## 2019-12-15 DIAGNOSIS — Z794 Long term (current) use of insulin: Secondary | ICD-10-CM | POA: Diagnosis not present

## 2019-12-15 DIAGNOSIS — E1122 Type 2 diabetes mellitus with diabetic chronic kidney disease: Secondary | ICD-10-CM | POA: Diagnosis not present

## 2019-12-15 DIAGNOSIS — J441 Chronic obstructive pulmonary disease with (acute) exacerbation: Secondary | ICD-10-CM | POA: Diagnosis not present

## 2019-12-15 DIAGNOSIS — N183 Chronic kidney disease, stage 3 unspecified: Secondary | ICD-10-CM | POA: Diagnosis not present

## 2019-12-15 DIAGNOSIS — F0281 Dementia in other diseases classified elsewhere with behavioral disturbance: Secondary | ICD-10-CM | POA: Diagnosis not present

## 2019-12-15 DIAGNOSIS — I129 Hypertensive chronic kidney disease with stage 1 through stage 4 chronic kidney disease, or unspecified chronic kidney disease: Secondary | ICD-10-CM | POA: Diagnosis not present

## 2019-12-15 DIAGNOSIS — N1832 Chronic kidney disease, stage 3b: Secondary | ICD-10-CM | POA: Diagnosis not present

## 2019-12-15 DIAGNOSIS — G301 Alzheimer's disease with late onset: Secondary | ICD-10-CM | POA: Diagnosis not present

## 2019-12-15 DIAGNOSIS — E039 Hypothyroidism, unspecified: Secondary | ICD-10-CM | POA: Diagnosis not present

## 2019-12-17 DIAGNOSIS — E538 Deficiency of other specified B group vitamins: Secondary | ICD-10-CM | POA: Diagnosis not present

## 2019-12-17 DIAGNOSIS — N1832 Chronic kidney disease, stage 3b: Secondary | ICD-10-CM | POA: Diagnosis not present

## 2019-12-17 DIAGNOSIS — E039 Hypothyroidism, unspecified: Secondary | ICD-10-CM | POA: Diagnosis not present

## 2019-12-17 DIAGNOSIS — E559 Vitamin D deficiency, unspecified: Secondary | ICD-10-CM | POA: Diagnosis not present

## 2019-12-17 DIAGNOSIS — E1122 Type 2 diabetes mellitus with diabetic chronic kidney disease: Secondary | ICD-10-CM | POA: Diagnosis not present

## 2019-12-17 DIAGNOSIS — N183 Chronic kidney disease, stage 3 unspecified: Secondary | ICD-10-CM | POA: Diagnosis not present

## 2019-12-17 DIAGNOSIS — I129 Hypertensive chronic kidney disease with stage 1 through stage 4 chronic kidney disease, or unspecified chronic kidney disease: Secondary | ICD-10-CM | POA: Diagnosis not present

## 2019-12-17 DIAGNOSIS — Z794 Long term (current) use of insulin: Secondary | ICD-10-CM | POA: Diagnosis not present

## 2019-12-18 DIAGNOSIS — J449 Chronic obstructive pulmonary disease, unspecified: Secondary | ICD-10-CM | POA: Diagnosis not present

## 2019-12-18 DIAGNOSIS — I10 Essential (primary) hypertension: Secondary | ICD-10-CM | POA: Diagnosis not present

## 2019-12-18 DIAGNOSIS — I5181 Takotsubo syndrome: Secondary | ICD-10-CM | POA: Diagnosis not present

## 2019-12-22 DIAGNOSIS — Z794 Long term (current) use of insulin: Secondary | ICD-10-CM | POA: Diagnosis not present

## 2019-12-22 DIAGNOSIS — E113291 Type 2 diabetes mellitus with mild nonproliferative diabetic retinopathy without macular edema, right eye: Secondary | ICD-10-CM | POA: Diagnosis not present

## 2019-12-22 DIAGNOSIS — D3131 Benign neoplasm of right choroid: Secondary | ICD-10-CM | POA: Diagnosis not present

## 2019-12-22 DIAGNOSIS — Z961 Presence of intraocular lens: Secondary | ICD-10-CM | POA: Diagnosis not present

## 2019-12-22 DIAGNOSIS — H35363 Drusen (degenerative) of macula, bilateral: Secondary | ICD-10-CM | POA: Diagnosis not present

## 2020-01-01 DIAGNOSIS — I10 Essential (primary) hypertension: Secondary | ICD-10-CM | POA: Diagnosis not present

## 2020-01-01 DIAGNOSIS — I5181 Takotsubo syndrome: Secondary | ICD-10-CM | POA: Diagnosis not present

## 2020-01-01 DIAGNOSIS — J449 Chronic obstructive pulmonary disease, unspecified: Secondary | ICD-10-CM | POA: Diagnosis not present

## 2020-02-02 DIAGNOSIS — J449 Chronic obstructive pulmonary disease, unspecified: Secondary | ICD-10-CM | POA: Diagnosis not present

## 2020-02-02 DIAGNOSIS — I10 Essential (primary) hypertension: Secondary | ICD-10-CM | POA: Diagnosis not present

## 2020-02-02 DIAGNOSIS — I5181 Takotsubo syndrome: Secondary | ICD-10-CM | POA: Diagnosis not present

## 2020-03-08 DIAGNOSIS — N183 Chronic kidney disease, stage 3 unspecified: Secondary | ICD-10-CM | POA: Diagnosis not present

## 2020-03-08 DIAGNOSIS — J441 Chronic obstructive pulmonary disease with (acute) exacerbation: Secondary | ICD-10-CM | POA: Diagnosis not present

## 2020-03-08 DIAGNOSIS — I129 Hypertensive chronic kidney disease with stage 1 through stage 4 chronic kidney disease, or unspecified chronic kidney disease: Secondary | ICD-10-CM | POA: Diagnosis not present

## 2020-03-08 DIAGNOSIS — I5181 Takotsubo syndrome: Secondary | ICD-10-CM | POA: Diagnosis not present

## 2020-04-12 DIAGNOSIS — E119 Type 2 diabetes mellitus without complications: Secondary | ICD-10-CM | POA: Diagnosis not present

## 2020-04-12 DIAGNOSIS — M79672 Pain in left foot: Secondary | ICD-10-CM | POA: Diagnosis not present

## 2020-04-12 DIAGNOSIS — L84 Corns and callosities: Secondary | ICD-10-CM | POA: Diagnosis not present

## 2020-04-12 DIAGNOSIS — B351 Tinea unguium: Secondary | ICD-10-CM | POA: Diagnosis not present

## 2020-04-12 DIAGNOSIS — M79671 Pain in right foot: Secondary | ICD-10-CM | POA: Diagnosis not present

## 2020-05-03 DIAGNOSIS — E1122 Type 2 diabetes mellitus with diabetic chronic kidney disease: Secondary | ICD-10-CM | POA: Diagnosis not present

## 2020-05-03 DIAGNOSIS — L03211 Cellulitis of face: Secondary | ICD-10-CM | POA: Diagnosis not present

## 2020-05-03 DIAGNOSIS — I129 Hypertensive chronic kidney disease with stage 1 through stage 4 chronic kidney disease, or unspecified chronic kidney disease: Secondary | ICD-10-CM | POA: Diagnosis not present

## 2020-05-03 DIAGNOSIS — N1831 Chronic kidney disease, stage 3a: Secondary | ICD-10-CM | POA: Diagnosis not present

## 2020-05-03 DIAGNOSIS — Z794 Long term (current) use of insulin: Secondary | ICD-10-CM | POA: Diagnosis not present

## 2020-05-03 DIAGNOSIS — G301 Alzheimer's disease with late onset: Secondary | ICD-10-CM | POA: Diagnosis not present

## 2020-05-03 DIAGNOSIS — E039 Hypothyroidism, unspecified: Secondary | ICD-10-CM | POA: Diagnosis not present

## 2020-05-03 DIAGNOSIS — F0281 Dementia in other diseases classified elsewhere with behavioral disturbance: Secondary | ICD-10-CM | POA: Diagnosis not present

## 2020-05-03 DIAGNOSIS — N183 Chronic kidney disease, stage 3 unspecified: Secondary | ICD-10-CM | POA: Diagnosis not present

## 2020-05-14 DIAGNOSIS — K861 Other chronic pancreatitis: Secondary | ICD-10-CM | POA: Diagnosis not present

## 2020-05-14 DIAGNOSIS — R5383 Other fatigue: Secondary | ICD-10-CM | POA: Diagnosis not present

## 2020-05-16 DIAGNOSIS — N179 Acute kidney failure, unspecified: Secondary | ICD-10-CM | POA: Diagnosis not present

## 2020-05-16 DIAGNOSIS — E875 Hyperkalemia: Secondary | ICD-10-CM | POA: Diagnosis not present

## 2020-05-16 DIAGNOSIS — R42 Dizziness and giddiness: Secondary | ICD-10-CM | POA: Diagnosis not present

## 2020-05-20 DIAGNOSIS — E875 Hyperkalemia: Secondary | ICD-10-CM | POA: Diagnosis not present

## 2020-05-20 DIAGNOSIS — L03211 Cellulitis of face: Secondary | ICD-10-CM | POA: Diagnosis not present

## 2020-05-20 DIAGNOSIS — N179 Acute kidney failure, unspecified: Secondary | ICD-10-CM | POA: Diagnosis not present

## 2020-05-20 DIAGNOSIS — R42 Dizziness and giddiness: Secondary | ICD-10-CM | POA: Diagnosis not present

## 2020-05-28 DIAGNOSIS — N179 Acute kidney failure, unspecified: Secondary | ICD-10-CM | POA: Diagnosis not present

## 2020-05-28 DIAGNOSIS — E875 Hyperkalemia: Secondary | ICD-10-CM | POA: Diagnosis not present

## 2020-09-13 DIAGNOSIS — L84 Corns and callosities: Secondary | ICD-10-CM | POA: Diagnosis not present

## 2020-09-13 DIAGNOSIS — B351 Tinea unguium: Secondary | ICD-10-CM | POA: Diagnosis not present

## 2020-09-13 DIAGNOSIS — M79671 Pain in right foot: Secondary | ICD-10-CM | POA: Diagnosis not present

## 2020-09-13 DIAGNOSIS — E119 Type 2 diabetes mellitus without complications: Secondary | ICD-10-CM | POA: Diagnosis not present

## 2020-09-13 DIAGNOSIS — M79672 Pain in left foot: Secondary | ICD-10-CM | POA: Diagnosis not present

## 2020-09-20 DIAGNOSIS — G309 Alzheimer's disease, unspecified: Secondary | ICD-10-CM | POA: Diagnosis not present

## 2020-09-20 DIAGNOSIS — F0281 Dementia in other diseases classified elsewhere with behavioral disturbance: Secondary | ICD-10-CM | POA: Diagnosis not present

## 2020-10-03 DIAGNOSIS — E162 Hypoglycemia, unspecified: Secondary | ICD-10-CM | POA: Diagnosis not present

## 2020-10-03 DIAGNOSIS — R0602 Shortness of breath: Secondary | ICD-10-CM | POA: Diagnosis not present

## 2020-10-03 DIAGNOSIS — R079 Chest pain, unspecified: Secondary | ICD-10-CM | POA: Diagnosis not present

## 2020-10-18 DIAGNOSIS — R399 Unspecified symptoms and signs involving the genitourinary system: Secondary | ICD-10-CM | POA: Diagnosis not present

## 2020-10-18 DIAGNOSIS — N179 Acute kidney failure, unspecified: Secondary | ICD-10-CM | POA: Diagnosis not present

## 2020-10-27 DIAGNOSIS — J440 Chronic obstructive pulmonary disease with acute lower respiratory infection: Secondary | ICD-10-CM | POA: Diagnosis not present

## 2020-10-27 DIAGNOSIS — R5383 Other fatigue: Secondary | ICD-10-CM | POA: Diagnosis not present

## 2020-10-27 DIAGNOSIS — Z20822 Contact with and (suspected) exposure to covid-19: Secondary | ICD-10-CM | POA: Diagnosis not present

## 2020-10-27 DIAGNOSIS — J209 Acute bronchitis, unspecified: Secondary | ICD-10-CM | POA: Diagnosis not present

## 2020-11-02 ENCOUNTER — Emergency Department (HOSPITAL_BASED_OUTPATIENT_CLINIC_OR_DEPARTMENT_OTHER): Payer: PPO

## 2020-11-02 ENCOUNTER — Observation Stay (HOSPITAL_BASED_OUTPATIENT_CLINIC_OR_DEPARTMENT_OTHER)
Admission: EM | Admit: 2020-11-02 | Discharge: 2020-11-03 | Disposition: A | Discharge status: Home / Self Care | Payer: PPO | Attending: Internal Medicine | Admitting: Internal Medicine

## 2020-11-02 ENCOUNTER — Other Ambulatory Visit: Payer: Self-pay

## 2020-11-02 ENCOUNTER — Encounter (HOSPITAL_BASED_OUTPATIENT_CLINIC_OR_DEPARTMENT_OTHER): Payer: Self-pay | Admitting: *Deleted

## 2020-11-02 DIAGNOSIS — Z20822 Contact with and (suspected) exposure to covid-19: Secondary | ICD-10-CM | POA: Insufficient documentation

## 2020-11-02 DIAGNOSIS — E1122 Type 2 diabetes mellitus with diabetic chronic kidney disease: Secondary | ICD-10-CM | POA: Diagnosis not present

## 2020-11-02 DIAGNOSIS — E860 Dehydration: Secondary | ICD-10-CM | POA: Diagnosis not present

## 2020-11-02 DIAGNOSIS — R55 Syncope and collapse: Secondary | ICD-10-CM | POA: Diagnosis not present

## 2020-11-02 DIAGNOSIS — S32502A Unspecified fracture of left pubis, initial encounter for closed fracture: Secondary | ICD-10-CM | POA: Diagnosis not present

## 2020-11-02 DIAGNOSIS — J449 Chronic obstructive pulmonary disease, unspecified: Secondary | ICD-10-CM | POA: Diagnosis not present

## 2020-11-02 DIAGNOSIS — N182 Chronic kidney disease, stage 2 (mild): Secondary | ICD-10-CM | POA: Diagnosis not present

## 2020-11-02 DIAGNOSIS — E119 Type 2 diabetes mellitus without complications: Secondary | ICD-10-CM | POA: Diagnosis not present

## 2020-11-02 DIAGNOSIS — K861 Other chronic pancreatitis: Secondary | ICD-10-CM | POA: Diagnosis not present

## 2020-11-02 DIAGNOSIS — N19 Unspecified kidney failure: Secondary | ICD-10-CM | POA: Diagnosis present

## 2020-11-02 DIAGNOSIS — E039 Hypothyroidism, unspecified: Secondary | ICD-10-CM | POA: Diagnosis not present

## 2020-11-02 DIAGNOSIS — I10 Essential (primary) hypertension: Secondary | ICD-10-CM | POA: Diagnosis present

## 2020-11-02 DIAGNOSIS — Z79899 Other long term (current) drug therapy: Secondary | ICD-10-CM | POA: Diagnosis not present

## 2020-11-02 DIAGNOSIS — E872 Acidosis, unspecified: Secondary | ICD-10-CM | POA: Diagnosis present

## 2020-11-02 DIAGNOSIS — N2889 Other specified disorders of kidney and ureter: Secondary | ICD-10-CM | POA: Diagnosis not present

## 2020-11-02 DIAGNOSIS — R079 Chest pain, unspecified: Secondary | ICD-10-CM | POA: Diagnosis present

## 2020-11-02 DIAGNOSIS — R0789 Other chest pain: Secondary | ICD-10-CM | POA: Diagnosis not present

## 2020-11-02 DIAGNOSIS — Z794 Long term (current) use of insulin: Secondary | ICD-10-CM | POA: Diagnosis not present

## 2020-11-02 DIAGNOSIS — M4317 Spondylolisthesis, lumbosacral region: Secondary | ICD-10-CM | POA: Diagnosis not present

## 2020-11-02 DIAGNOSIS — I2510 Atherosclerotic heart disease of native coronary artery without angina pectoris: Secondary | ICD-10-CM | POA: Diagnosis not present

## 2020-11-02 DIAGNOSIS — J432 Centrilobular emphysema: Secondary | ICD-10-CM | POA: Diagnosis not present

## 2020-11-02 DIAGNOSIS — R112 Nausea with vomiting, unspecified: Secondary | ICD-10-CM | POA: Diagnosis not present

## 2020-11-02 DIAGNOSIS — N281 Cyst of kidney, acquired: Secondary | ICD-10-CM | POA: Diagnosis not present

## 2020-11-02 DIAGNOSIS — Z87891 Personal history of nicotine dependence: Secondary | ICD-10-CM | POA: Insufficient documentation

## 2020-11-02 DIAGNOSIS — S2241XA Multiple fractures of ribs, right side, initial encounter for closed fracture: Secondary | ICD-10-CM | POA: Diagnosis not present

## 2020-11-02 DIAGNOSIS — I129 Hypertensive chronic kidney disease with stage 1 through stage 4 chronic kidney disease, or unspecified chronic kidney disease: Secondary | ICD-10-CM | POA: Diagnosis not present

## 2020-11-02 DIAGNOSIS — R531 Weakness: Secondary | ICD-10-CM

## 2020-11-02 HISTORY — DX: Takotsubo syndrome: I51.81

## 2020-11-02 HISTORY — DX: Malignant (primary) neoplasm, unspecified: C80.1

## 2020-11-02 LAB — COMPREHENSIVE METABOLIC PANEL
ALT: 16 U/L (ref 0–44)
AST: 21 U/L (ref 15–41)
Albumin: 3.8 g/dL (ref 3.5–5.0)
Alkaline Phosphatase: 88 U/L (ref 38–126)
Anion gap: 11 (ref 5–15)
BUN: 52 mg/dL — ABNORMAL HIGH (ref 8–23)
CO2: 21 mmol/L — ABNORMAL LOW (ref 22–32)
Calcium: 9.8 mg/dL (ref 8.9–10.3)
Chloride: 107 mmol/L (ref 98–111)
Creatinine, Ser: 1.48 mg/dL — ABNORMAL HIGH (ref 0.44–1.00)
GFR, Estimated: 36 mL/min — ABNORMAL LOW (ref >60)
Glucose, Bld: 128 mg/dL — ABNORMAL HIGH (ref 70–99)
Potassium: 4 mmol/L (ref 3.5–5.1)
Sodium: 139 mmol/L (ref 135–145)
Total Bilirubin: 0.5 mg/dL (ref 0.3–1.2)
Total Protein: 7.5 g/dL (ref 6.5–8.1)

## 2020-11-02 LAB — BLOOD GAS, VENOUS
Acid-base deficit: 2.8 mmol/L — ABNORMAL HIGH (ref 0.0–2.0)
Bicarbonate: 22.9 mmol/L (ref 20.0–28.0)
O2 Saturation: 23.7 %
Patient temperature: 98.6
pCO2, Ven: 46.5 mmHg (ref 44.0–60.0)
pH, Ven: 7.313 (ref 7.250–7.430)
pO2, Ven: <31 mmHg — CL (ref 32.0–45.0)

## 2020-11-02 LAB — URINALYSIS, ROUTINE W REFLEX MICROSCOPIC
Bilirubin Urine: NEGATIVE
Glucose, UA: NEGATIVE mg/dL
Ketones, ur: NEGATIVE mg/dL
Leukocytes,Ua: NEGATIVE
Nitrite: NEGATIVE
Protein, ur: >300 mg/dL — AB
Specific Gravity, Urine: 1.02 (ref 1.005–1.030)
pH: 5.5 (ref 5.0–8.0)

## 2020-11-02 LAB — CBC WITH DIFFERENTIAL/PLATELET
Abs Immature Granulocytes: 0.39 10*3/uL — ABNORMAL HIGH (ref 0.00–0.07)
Basophils Absolute: 0.1 10*3/uL (ref 0.0–0.1)
Basophils Relative: 1 %
Eosinophils Absolute: 0.1 10*3/uL (ref 0.0–0.5)
Eosinophils Relative: 1 %
HCT: 38.2 % (ref 36.0–46.0)
Hemoglobin: 12.3 g/dL (ref 12.0–15.0)
Immature Granulocytes: 2 %
Lymphocytes Relative: 20 %
Lymphs Abs: 3.4 10*3/uL (ref 0.7–4.0)
MCH: 29.4 pg (ref 26.0–34.0)
MCHC: 32.2 g/dL (ref 30.0–36.0)
MCV: 91.2 fL (ref 80.0–100.0)
Monocytes Absolute: 1.5 10*3/uL — ABNORMAL HIGH (ref 0.1–1.0)
Monocytes Relative: 9 %
Neutro Abs: 11.9 10*3/uL — ABNORMAL HIGH (ref 1.7–7.7)
Neutrophils Relative %: 67 %
Platelets: 344 10*3/uL (ref 150–400)
RBC: 4.19 MIL/uL (ref 3.87–5.11)
RDW: 13.7 % (ref 11.5–15.5)
WBC: 17.4 10*3/uL — ABNORMAL HIGH (ref 4.0–10.5)
nRBC: 0 % (ref 0.0–0.2)

## 2020-11-02 LAB — URINALYSIS, MICROSCOPIC (REFLEX)

## 2020-11-02 LAB — CBG MONITORING, ED
Glucose-Capillary: 92 mg/dL (ref 70–99)
Glucose-Capillary: 101 mg/dL — ABNORMAL HIGH (ref 70–99)

## 2020-11-02 LAB — RESP PANEL BY RT-PCR (FLU A&B, COVID) ARPGX2
Influenza A by PCR: NEGATIVE
Influenza B by PCR: NEGATIVE
SARS Coronavirus 2 by RT PCR: NEGATIVE

## 2020-11-02 LAB — GLUCOSE, CAPILLARY: Glucose-Capillary: 83 mg/dL (ref 70–99)

## 2020-11-02 LAB — LACTIC ACID, PLASMA
Lactic Acid, Venous: 2.9 mmol/L (ref 0.5–1.9)
Lactic Acid, Venous: 2.6 mmol/L (ref 0.5–1.9)

## 2020-11-02 LAB — MAGNESIUM: Magnesium: 1.7 mg/dL (ref 1.7–2.4)

## 2020-11-02 LAB — TROPONIN I (HIGH SENSITIVITY)
Troponin I (High Sensitivity): 22 ng/L — ABNORMAL HIGH (ref <18)
Troponin I (High Sensitivity): 22 ng/L — ABNORMAL HIGH (ref <18)

## 2020-11-02 LAB — LIPASE, BLOOD: Lipase: 40 U/L (ref 11–51)

## 2020-11-02 MED ORDER — SODIUM CHLORIDE 0.9 % IV SOLN
1.0000 g | Freq: Once | INTRAVENOUS | Status: AC
  Administered 2020-11-02: 1 g via INTRAVENOUS
  Filled 2020-11-02: qty 10

## 2020-11-02 MED ORDER — ALBUTEROL SULFATE (2.5 MG/3ML) 0.083% IN NEBU: 3.0000 mL | INHALATION_SOLUTION | Freq: Four times a day (QID) | RESPIRATORY_TRACT | Status: DC | PRN

## 2020-11-02 MED ORDER — SODIUM CHLORIDE 0.9 % IV SOLN: INTRAVENOUS | Status: DC

## 2020-11-02 MED ORDER — HYDRALAZINE HCL 20 MG/ML IJ SOLN
10.0000 mg | INTRAMUSCULAR | Status: DC | PRN
  Administered 2020-11-02: 10 mg via INTRAVENOUS
  Filled 2020-11-02: qty 1

## 2020-11-02 MED ORDER — SODIUM CHLORIDE 0.9 % IV SOLN: INTRAVENOUS | Status: DC | PRN

## 2020-11-02 MED ORDER — LEVOTHYROXINE SODIUM 75 MCG PO TABS
75.0000 ug | ORAL_TABLET | Freq: Every day | ORAL | Status: DC
  Administered 2020-11-03: 75 ug via ORAL
  Filled 2020-11-02: qty 1

## 2020-11-02 MED ORDER — ACETAMINOPHEN 650 MG RE SUPP: 650.0000 mg | Freq: Four times a day (QID) | RECTAL | Status: DC | PRN

## 2020-11-02 MED ORDER — NITROGLYCERIN 0.4 MG SL SUBL: 0.4000 mg | SUBLINGUAL_TABLET | SUBLINGUAL | Status: DC | PRN

## 2020-11-02 MED ORDER — ONDANSETRON HCL 4 MG/2ML IJ SOLN: 4.0000 mg | Freq: Four times a day (QID) | INTRAMUSCULAR | Status: DC | PRN

## 2020-11-02 MED ORDER — INSULIN ASPART 100 UNIT/ML IJ SOLN: 0.0000 [IU] | Freq: Three times a day (TID) | INTRAMUSCULAR | Status: DC

## 2020-11-02 MED ORDER — ACETAMINOPHEN 325 MG PO TABS: 650.0000 mg | ORAL_TABLET | Freq: Four times a day (QID) | ORAL | Status: DC | PRN

## 2020-11-02 MED ORDER — IOHEXOL 350 MG/ML SOLN
75.0000 mL | Freq: Once | INTRAVENOUS | Status: AC | PRN
  Administered 2020-11-02: 75 mL via INTRAVENOUS

## 2020-11-02 MED ORDER — HYDROXYZINE HCL 25 MG PO TABS: 25.0000 mg | ORAL_TABLET | Freq: Three times a day (TID) | ORAL | Status: DC | PRN

## 2020-11-02 MED ORDER — MAGNESIUM SULFATE 2 GM/50ML IV SOLN
2.0000 g | Freq: Once | INTRAVENOUS | Status: AC
  Administered 2020-11-02: 2 g via INTRAVENOUS
  Filled 2020-11-02: qty 50

## 2020-11-02 MED ORDER — SODIUM CHLORIDE 0.9 % IV SOLN
500.0000 mg | Freq: Once | INTRAVENOUS | Status: AC
  Administered 2020-11-02: 500 mg via INTRAVENOUS
  Filled 2020-11-02: qty 500

## 2020-11-02 MED ORDER — SODIUM CHLORIDE 0.9 % IV BOLUS
1000.0000 mL | Freq: Once | INTRAVENOUS | Status: AC
  Administered 2020-11-02: 1000 mL via INTRAVENOUS

## 2020-11-02 MED ORDER — LISINOPRIL 10 MG PO TABS
10.0000 mg | ORAL_TABLET | Freq: Two times a day (BID) | ORAL | Status: DC
  Administered 2020-11-02 – 2020-11-03 (×2): 10 mg via ORAL
  Filled 2020-11-02 (×2): qty 1

## 2020-11-02 MED ORDER — MOMETASONE FURO-FORMOTEROL FUM 200-5 MCG/ACT IN AERO
2.0000 | INHALATION_SPRAY | Freq: Two times a day (BID) | RESPIRATORY_TRACT | Status: DC
  Administered 2020-11-03: 2 via RESPIRATORY_TRACT
  Filled 2020-11-02: qty 8.8

## 2020-11-02 NOTE — ED Notes (Signed)
US at bedside

## 2020-11-02 NOTE — ED Notes (Signed)
Report called to [place], Therapist, sports at [employer]

## 2020-11-02 NOTE — ED Notes (Signed)
ED Provider at bedside. 

## 2020-11-02 NOTE — ED Notes (Signed)
Pt transported to CT ?

## 2020-11-02 NOTE — ED Triage Notes (Signed)
Pt brought in by her daughter. Reports pt c/o chest pain and back pain 1 hour pta. Pt had gone to [facility] and started vomiting when pain started. Also reports pt had diarrhea. Pt was in registration and slumped over, found to be vomiting, minimally responsive. Also hx of alzheimers. Family reports she has had similar episodes in the past. Taken to room 14. Able to move to stretcher with assist

## 2020-11-02 NOTE — Plan of Care (Signed)
Oriented to room and call bell use. Call bell within reach. Family at patient's bedside. Progressing towards goals.

## 2020-11-02 NOTE — ED Notes (Signed)
ED Provider at bedside. Dr. Curatolo 

## 2020-11-02 NOTE — ED Provider Notes (Signed)
[facility] Provider Note   CSN: [id number] Arrival date & time: 11/02/20  1404     History Chief Complaint  Patient presents with   Chest Pain    Lisa Nelson is a 80 y.o. female.  Patient arrives after having sudden episode of chest pain that radiates into her back.  Has been on antibiotic and steroids for lung infection.  She is not on oxygen chronically.  She woke up this morning feeling well and then had some sudden chest pain and nausea and vomiting and 1 episode of diarrhea.  She is feeling better but very weak.  The history is provided by the patient and a caregiver.  Chest Pain Pain location:  Substernal area Pain quality: aching   Pain radiates to:  Upper back Pain severity:  Moderate Onset quality:  Sudden Duration:  1 hour Timing:  Constant Progression:  Improving Chronicity:  New Context: at rest   Relieved by:  Nothing Worsened by:  Nothing Associated symptoms: nausea and vomiting   Associated symptoms: no abdominal pain, no back pain, no cough, no fever, no palpitations and no shortness of breath   Risk factors: diabetes mellitus   Risk factors comment:  COPD     Past Medical History:  Diagnosis Date   Broken heart syndrome    Cancer ([place])    COPD (chronic obstructive pulmonary disease) ([facility])    Diabetes mellitus without complication ([place])    Hip fracture ([place]) 10/13/2018   L hip   Memory deficit     There are no problems to display for this patient.   Past Surgical History:  Procedure Laterality Date   ABDOMINAL HYSTERECTOMY       OB History   No obstetric history on file.     No family history on file.  Social History   Tobacco Use   Smoking status: Former   Smokeless tobacco: Never  Substance Use Topics   Alcohol use: Not Currently   Drug use: Never    Home Medications Prior to Admission medications   Medication Sig Start Date End Date Taking? Authorizing Provider  atorvastatin (LIPITOR)  20 MG tablet Take by mouth. 01/26/18  Yes [provider]  Cholecalciferol (VITAMIN D-1000 MAX ST) 25 MCG (1000 UT) tablet Take by mouth.   Yes [provider]  donepezil (ARICEPT) 10 MG tablet TAKE ONE TABLET BY MOUTH  EVERY EVENING 09/13/15  Yes [provider]  insulin NPH Human (NOVOLIN N) 100 UNIT/ML injection Inject up to 25 units [place] once daily 10/02/15  Yes [provider]  levothyroxine (SYNTHROID) 88 MCG tablet Take by mouth. 06/22/18  Yes [provider]  lisinopril (ZESTRIL) 20 MG tablet Take by mouth. 01/26/18  Yes [provider]  memantine (NAMENDA) 10 MG tablet Take by mouth. 07/29/18  Yes [provider]  metoprolol tartrate (LOPRESSOR) 25 MG tablet Take 1/2 to 1 tablet daily 03/18/18  Yes [provider]  QUEtiapine (SEROQUEL) 25 MG tablet Take by mouth. 12/17/17  Yes [provider]  hydrOXYzine (ATARAX/VISTARIL) 25 MG tablet 1/2 or 1 tab prn anxiety 07/29/18   [provider]  ibuprofen (ADVIL) 600 MG tablet Take 1 tablet (600 mg total) by mouth every 6 (six) hours as needed. 01/14/19   Rancour, Annie Main, MD  lidocaine (LIDODERM) 5 % Place 1 patch onto the skin daily. Remove & Discard patch within 12 hours or as directed by MD 10/27/18   Gareth Morgan, MD  oxyCODONE (ROXICODONE)  5 MG immediate release tablet Take 1 tablet (5 mg total) by mouth every 4 (four) hours as needed for severe pain. 10/27/18   Gareth Morgan, MD    Allergies    Patient has no active allergies.  Review of Systems   Review of Systems  Constitutional:  Negative for chills and fever.  HENT:  Negative for ear pain and sore throat.   Eyes:  Negative for pain and visual disturbance.  Respiratory:  Negative for cough and shortness of breath.   Cardiovascular:  Positive for chest pain. Negative for palpitations.  Gastrointestinal:  Positive for nausea and vomiting. Negative for abdominal pain.  Genitourinary:  Negative for  dysuria and hematuria.  Musculoskeletal:  Negative for arthralgias and back pain.  Skin:  Negative for color change and rash.  Neurological:  Negative for seizures and syncope.  All other systems reviewed and are negative.  Physical Exam Updated Vital Signs BP 124/63   Pulse 66   Temp (!) 96.7 F (35.9 C) (Tympanic)   Resp 16   Ht 5\' 4"  (1.626 m)   Wt 55.3 kg   SpO2 100%   BMI 20.94 kg/m   Physical Exam Vitals and nursing note reviewed.  Constitutional:      General: She is not in acute distress.    Appearance: She is well-developed. She is ill-appearing.  HENT:     Head: Normocephalic and atraumatic.  Eyes:     Extraocular Movements: Extraocular movements intact.     Conjunctiva/sclera: Conjunctivae normal.     Pupils: Pupils are equal, round, and reactive to light.  Cardiovascular:     Rate and Rhythm: Normal rate and regular rhythm.     Pulses:          Radial pulses are 1+ on the right side and 2+ on the left side.     Heart sounds: Normal heart sounds. No murmur heard. Pulmonary:     Effort: Pulmonary effort is normal. No respiratory distress.     Breath sounds: Decreased breath sounds present. No wheezing.  Abdominal:     Palpations: Abdomen is soft.     Tenderness: There is no abdominal tenderness.  Musculoskeletal:        General: Normal range of motion.     Cervical back: Normal range of motion and neck supple.     Right lower leg: No edema.     Left lower leg: No edema.  Skin:    General: Skin is warm and dry.  Neurological:     General: No focal deficit present.     Mental Status: She is alert.  Psychiatric:        Mood and Affect: Mood normal.    ED Results / Procedures / Treatments   Labs (all labs ordered are listed, but only abnormal results are displayed) Labs Reviewed  CBC WITH DIFFERENTIAL/PLATELET - Abnormal; Notable for the following components:      Result Value   WBC 17.4 (*)    Neutro Abs 11.9 (*)    Monocytes Absolute 1.5 (*)     Abs Immature Granulocytes 0.39 (*)    All other components within normal limits  CULTURE, BLOOD (ROUTINE X 2)  CULTURE, BLOOD (ROUTINE X 2)  URINE CULTURE  RESP PANEL BY RT-PCR (FLU A&B, COVID) ARPGX2  COMPREHENSIVE METABOLIC PANEL  LIPASE, BLOOD  LACTIC ACID, PLASMA  LACTIC ACID, PLASMA  URINALYSIS, ROUTINE W REFLEX MICROSCOPIC  BLOOD GAS, VENOUS  CBG MONITORING, ED  TROPONIN I (HIGH SENSITIVITY)  EKG EKG Interpretation  Date/Time:  Saturday November 02 2020 14:12:11 EDT Ventricular Rate:  70 PR Interval:  156 QRS Duration: 99 QT Interval:  410 QTC Calculation: 443 R Axis:   -9 Text Interpretation: Sinus rhythm Abnormal T, consider ischemia, lateral leads Confirmed by Lennice Sites ([id number]) on 11/02/2020 2:14:12 PM  Radiology DG Chest Portable 1 View  Result Date: 11/02/2020 CLINICAL DATA:  Chest pain and back pain for 1 hour. EXAM: PORTABLE CHEST 1 VIEW COMPARISON:  Chest radiograph dated 11/28/2019. FINDINGS: The heart size is normal. Vascular calcifications are seen in the aortic arch. Both lungs are clear. Chronic deformity of multiple right-sided ribs is redemonstrated. Degenerative changes are seen in the spine. IMPRESSION: No active disease. Aortic Atherosclerosis (ICD10-I70.0). Electronically Signed   By: Zerita Boers M.D.   On: 11/02/2020 14:46    Procedures Procedures   Medications Ordered in ED Medications  sodium chloride 0.9 % bolus 1,000 mL (has no administration in time range)  cefTRIAXone (ROCEPHIN) 1 g in sodium chloride 0.9 % 100 mL IVPB (has no administration in time range)  azithromycin (ZITHROMAX) 500 mg in sodium chloride 0.9 % 250 mL IVPB (has no administration in time range)    ED Course  I have reviewed the triage vital signs and the nursing notes.  Pertinent labs & imaging results that were available during my care of the patient were reviewed by me and considered in my medical decision making (see chart for details).    MDM  Rules/Calculators/A&P                           CAMIAH HUMM is here with chest pain.  History of COPD.  Currently on antibiotics and steroid for presumed lung infection.  Was feeling better this morning.  Then suddenly had some chest pain that radiated to her upper back.  Had nausea, vomiting, episode of diarrhea afterwards.  She is felt very weak afterwards.  She has had similar episodes like this in the past.  She arrives looking ill.  Blood pressure and pulses seem unequal in the upper extremities on exam.  She has diminished breath sounds throughout.  No signs of volume overload on exam.  Neurologically she appears to be intact.  She is mildly hypothermic but otherwise vitals are normal.  She is awake and alert.  Looks pale.  She was placed on nonrebreather as it has been difficult to get a good pulse ox on her.  Sepsis work-up initiated and will start some IV antibiotics however concern also for cardiac process.  Will get troponin.  We will get a CT dissection study given her history and physical.  EKG shows sinus rhythm.  No obvious ischemic changes.  Unchanged from prior EKGs.  She has a white count of 17 but otherwise no significant anemia.  Leukocytosis could be secondary to steroid use.  Chest x-ray shows no evidence of infection or volume overload.  Patient was handed off to oncoming ED staff.  Please see their note for further results, evaluation, disposition of the patient.  This chart was dictated using voice recognition software.  Despite best efforts to proofread,  errors can occur which can change the documentation meaning.   Final Clinical Impression(s) / ED Diagnoses Final diagnoses:  Chest pain, unspecified type    Rx / DC Orders ED Discharge Orders     None        Lennice Sites, DO 11/02/20 1508

## 2020-11-02 NOTE — H&P (Signed)
History and Physical    PLEASE NOTE THAT [employer]AGON DICT[employer] SOFTWARE WAS USED IN THE CONSTRUCTION OF THIS NOTE.   Lisa Nelson LPF:[id number] DOB: 03-12-1940 DOA: 11/02/2020  PCP: Rubie Maid, MD Patient coming from: home   I have personally briefly reviewed patient's old medical records in [place]  Chief Complaint: Chest pain  HPI: Lisa Nelson is a 80 y.o. female with medical history significant for COPD, essential hypertension, type 2 diabetes mellitus, stage IIIb chronic kidney disease with baseline creatinine 1.4-1.7, who is admitted to [facility] on 11/02/2020 by way of transfer from [place] emergency department for further evaluation and management of chest pain after presenting from home to the latter facility complaining of such.  The following history was obtained via my discussions with the patient as well as my discussions with the patient's daughter, who is present at bedside, in addition to chart review.  On 10/27/2020, in the setting of 3 days of new onset nonproductive cough, the patient was diagnosed with acute bronchitis at a local urgent care, prompting initiation of a course of doxycycline as well as 5-day course of prednisone, which she compliantly completed, noting interval resolution of her preceding cough.  However, on the morning of 11/02/2020 the patient developed an episode of sharp, substernal chest pain radiating to the back that started suddenly while she was eating breakfast.  She states that the chest pain was nonexertional, nonpleuritic, not reproducible with direct palpation of the anterior chest wall, nonpositional.  Pain was constant for approximately 45 minutes before spontaneous resolution in the absence of any nitroglycerin, and denies any ensuing recurrence of her chest discomfort.  She reports that the chest pain was associated with 2 episodes of nausea resulting in nonbloody, nonbilious emesis.  She also notes a  total of 3 episodes of loose stool over the last day, in the absence of any melena or hematochezia.  While recently on the aforementioned course of doxycycline, the patient reports sensation of mild abdominal bloating, but denies any overt abdominal discomfort.  Denies any associated shortness of breath, palpitations, orthopnea, PND, or new onset peripheral edema.  Denies any recent wheezing, hemoptysis, new lower extremity erythema, or calf tenderness.  No recent trauma, travel, surgical procedures, or periods of prolonged diminished ambulatory status.  Any associated subjective fever, chills, rigors, generalized myalgias.  No sore throat or rash.  She also denies any acute urinary symptoms, including no recent dysuria, gross hematuria, or change in urinary urgency/urgency.  Reports generalized weakness over the last 5 to 6 day, in the absence of any acute arthralgias or myalgias.  In the context of the aforementioned episode of self-limited chest discomfort with the patient presented to [place] emergency department on 11/02/2020 for further evaluation thereof.  While at [facility], patient reportedly had an episode of syncope in which she lost consciousness while in a seated position during routine intake.  Loss of consciousness lasted for less than 1 minute, was not reported to be associated with any tonic-clonic activity, loss of bowel/bladder function, tongue biting.  No ensuing acute confusion upon regaining consciousness.  The patient denies any dizziness, lightheadedness or subjective sensation of impending loss of consciousness leading up to this episode of syncope, and states that the chest pain had already resolved prior to this episode occurring.  Denies any associated acute focal weakness, numbness, paresthesias, vertigo, acute change in vision, dysphagia, slurred speech, or facial droop.  The patient has  reportedly experienced prior episodes of chest discomfort the last few  years, noting that every 3 to 4 months that she typically experiences very similar sharp, substernal chest pain radiating to the back, with today's episode of chest pain very similar in terms of quality and intensity relative to these previous episodes.  However, the patient reports that today's episode of chest discomfort, which occurred over the course of 45 minutes, lasted much longer than any of her previous episodes of chest discomfort, and that she has never previously experienced a syncopal event in proximity to prior chest pain episodes.  In the context of the several previous episodes of chest pain, the patient follows with cardiology as an outpatient, and is a component of evaluation of these recurrent episodes underwent evaluation via Holter monitor with event recorder, without any episodes of chest discomfort occurring during the period of time in which she was on the Holter monitor.  The patient does not believe that she has recently undergone any stress testing nor any coronary angiography, and conveys no imminent plan from her outpatient cardiologist to pursue further ischemic evaluation via these mechanisms.  Of note, per chart review, there is documentation of history of Takotsubo cardiomyopathy 5 years ago, although I have not yet located results of associated echocardiogram.  Per chart review, patient has a history of stage IIIb chronic kidney disease with baseline creatinine range of 1.4-1.7, with most recent prior serum creatinine data point noted to be 1.69 on 10/18/2020.    [facility] ED Course:  Vital signs in the ED were notable for the following: Temperature max 98.2, heart rate 61-72; blood pressure 124/63-170/58; respiratory rate 14-22, oxygen saturation 97 to 99% on room air.  Labs were notable for the following: VBG 7.313/46.5.  CMP notable for the following potassium 4.0, bicarbonate 21, anion gap 11, BUN 52, creatinine 1.48.  To most recent prior value of 1.69  on 10/18/2020, BUN/creatinine ratio 35, and liver enzymes were found to be within normal limits.  High-sensitivity troponin IV x2 values were both found to be 22.  C. difficile PCR was ordered [place], with results currently pending.  Initial lactic acid 2.9, with repeat value trending down to 2.6.  CBC notable for white blood cell count of 17,400, both with most recent prior value 9600 on 10/18/2020, hemoglobin 12.3.  Urinalysis showed 6-10 white blood cells, leukocyte Estrace negative, nitrate negative, positive for hyaline casts, and showed specific gravity 1.020 nasopharyngeal COVID-19/influenza PCR were checked at [facility] ED today and found to be negative.  Blood cultures x2 were collected prior to initiation of IV antibiotics.  Serum magnesium level noted to be 1.7.  Imaging and additional notable ED work-up: EKG, in comparison to most recent prior from 11/28/2019 showed sinus rhythm with heart rate 70, normal intervals, T wave inversion in leads I and aVL, which appear unchanged relative to most recent prior EKG, and showed no evidence of ST changes, including no evidence of ST elevation.  Chest x-ray showed no evidence of acute cardiopulmonary process.  Chest pain with radiation to the back, CTA chest, abdomen, pelvis, with dissection protocol was pursued, and showed no evidence of acute aortic injury, including no evidence of aneurysm or dissection, will this imaging showed fluid-filled colon consistent with recent diarrhea, but in the absence of any evidence of bowel obstruction, abscess, or perforation, nor any evidence of bowel wall thickening.  No evidence of acute cholecystitis or choledocholithiasis.  Additionally, the chest  component of this CTA showed no evidence of infiltrate, edema, effusion, or pneumothorax.  While in the ED, the following were administered: Azithromycin Rocephin, and a 1 L normal saline bolus.  Socially, the patient was transferred to the med  telemetry unit at [facility] for further evaluation and management of presenting chest pain as well as episode of syncope.     Review of Systems: As per HPI otherwise 10 point review of systems negative.   Past Medical History:  Diagnosis Date   Broken heart syndrome    Cancer (HCC)    COPD (chronic obstructive pulmonary disease) ([place])    Diabetes mellitus without complication ([place])    Hip fracture ([place]) 10/13/2018   L hip   Memory deficit     Past Surgical History:  Procedure Laterality Date   ABDOMINAL HYSTERECTOMY      Social History:  reports that she has quit smoking. She has never used smokeless tobacco. She reports that she does not currently use alcohol. She reports that she does not use drugs.   No Active Allergies   Family history reviewed and not pertinent    Prior to Admission medications   Medication Sig Start Date End Date Taking? Authorizing Provider  atorvastatin (LIPITOR) 20 MG tablet Take by mouth. 01/26/18  Yes [provider]  Cholecalciferol (VITAMIN D-1000 MAX ST) 25 MCG (1000 UT) tablet Take by mouth.   Yes [provider]  donepezil (ARICEPT) 10 MG tablet TAKE ONE TABLET BY MOUTH  EVERY EVENING 09/13/15  Yes [provider]  insulin NPH Human (NOVOLIN N) 100 UNIT/ML injection Inject up to 25 units [place] once daily 10/02/15  Yes [provider]  levothyroxine (SYNTHROID) 88 MCG tablet Take by mouth. 06/22/18  Yes [provider]  lisinopril (ZESTRIL) 20 MG tablet Take by mouth. 01/26/18  Yes [provider]  memantine (NAMENDA) 10 MG tablet Take by mouth. 07/29/18  Yes [provider]  metoprolol tartrate (LOPRESSOR) 25 MG tablet Take 1/2 to 1 tablet daily 03/18/18  Yes [provider]  QUEtiapine (SEROQUEL) 25 MG tablet Take by mouth. 12/17/17  Yes [provider]  hydrOXYzine (ATARAX/VISTARIL) 25 MG tablet 1/2 or 1 tab prn anxiety 07/29/18   [provider]  ibuprofen  (ADVIL) 600 MG tablet Take 1 tablet (600 mg total) by mouth every 6 (six) hours as needed. 01/14/19   Rancour, Annie Main, MD  lidocaine (LIDODERM) 5 % Place 1 patch onto the skin daily. Remove & Discard patch within 12 hours or as directed by MD 10/27/18   Gareth Morgan, MD  oxyCODONE (ROXICODONE) 5 MG immediate release tablet Take 1 tablet (5 mg total) by mouth every 4 (four) hours as needed for severe pain. 10/27/18   Gareth Morgan, MD     Objective    Physical Exam: Vitals:   11/02/20 1800 11/02/20 1900 11/02/20 1930 11/02/20 2218  BP: (!) 152/70 (!) 181/73 (!) 170/58 (!) 198/75  Pulse: 69 69 67 68  Resp: _0 Temp:    98.2 F (36.8 C)  TempSrc:    Oral  SpO2: 97% 97% 97% 97%  Weight:      Height:        General: appears to be stated age; alert Skin: warm, dry, no rash Head:  AT/[place] Mouth:  Oral mucosa membranes appear dry, normal dentition Neck: supple; trachea midline Heart:  RRR; did not appreciate any M/R/G Lungs: CTAB, did not appreciate any wheezes, rales, or rhonchi Abdomen: +  BS; soft, ND, NT Vascular: 2+ pedal pulses b/l; 2+ radial pulses b/l Extremities: no peripheral edema, no muscle wasting Neuro: strength and sensation intact in upper and lower extremities b/l    Labs on Admission: I have personally reviewed following labs and imaging studies  CBC: Recent Labs  Lab 11/02/20 1443  WBC 17.4*  NEUTROABS 11.9*  HGB 12.3  HCT 38.2  MCV 91.2  PLT 326   Basic Metabolic Panel: Recent Labs  Lab 11/02/20 1443  NA 139  K 4.0  CL 107  CO2 21*  GLUCOSE 128*  BUN 52*  CREATININE 1.48*  CALCIUM 9.8   GFR: Estimated Creatinine Clearance: 26.6 mL/min (A) (by C-G formula based on SCr of 1.48 mg/dL (H)). Liver Function Tests: Recent Labs  Lab 11/02/20 1443  AST 21  ALT 16  ALKPHOS 88  BILITOT 0.5  PROT 7.5  ALBUMIN 3.8   Recent Labs  Lab 11/02/20 1443  LIPASE 40   No results for input(s): AMMONIA in the last 168  hours. Coagulation Profile: No results for input(s): INR, PROTIME in the last 168 hours. Cardiac Enzymes: No results for input(s): CKTOTAL, CKMB, CKMBINDEX, TROPONINI in the last 168 hours. BNP (last 3 results) No results for input(s): PROBNP in the last 8760 hours. HbA1C: No results for input(s): HGBA1C in the last 72 hours. CBG: Recent Labs  Lab 11/02/20 1424 11/02/20 1719  GLUCAP 92 101*   Lipid Profile: No results for input(s): CHOL, HDL, LDLCALC, TRIG, CHOLHDL, LDLDIRECT in the last 72 hours. Thyroid Function Tests: No results for input(s): TSH, T4TOTAL, FREET4, T3FREE, THYROIDAB in the last 72 hours. Anemia Panel: No results for input(s): VITAMINB12, FOLATE, FERRITIN, TIBC, IRON, RETICCTPCT in the last 72 hours. Urine analysis:    Component Value Date/Time   COLORURINE YELLOW 11/02/2020 [place] 11/02/2020 1523   LABSPEC 1.020 11/02/2020 1523   PHURINE 5.5 11/02/2020 1523   GLUCOSEU NEGATIVE 11/02/2020 1523   HGBUR TRACE (A) 11/02/2020 1523   BILIRUBINUR NEGATIVE 11/02/2020 1523   KETONESUR NEGATIVE 11/02/2020 1523   PROTEINUR >300 (A) 11/02/2020 1523   NITRITE NEGATIVE 11/02/2020 1523   LEUKOCYTESUR NEGATIVE 11/02/2020 1523    Radiological Exams on Admission: DG Chest Portable 1 View  Result Date: 11/02/2020 CLINICAL DATA:  Chest pain and back pain for 1 hour. EXAM: PORTABLE CHEST 1 VIEW COMPARISON:  Chest radiograph dated 11/28/2019. FINDINGS: The heart size is normal. Vascular calcifications are seen in the aortic arch. Both lungs are clear. Chronic deformity of multiple right-sided ribs is redemonstrated. Degenerative changes are seen in the spine. IMPRESSION: No active disease. Aortic Atherosclerosis (ICD10-I70.0). Electronically Signed   By: Zerita Boers M.D.   On: 11/02/2020 14:46   CT Angio Chest/Abd/Pel for Dissection W and/or Wo Contrast  Result Date: 11/02/2020 CLINICAL DATA:  chest pain, n/v, back pain, unequal pulses EXAM: CT  ANGIOGRAPHY CHEST, ABDOMEN AND PELVIS TECHNIQUE: Non-contrast CT of the chest was initially obtained. Multidetector CT imaging through the chest, abdomen and pelvis was performed using the standard protocol during bolus administration of intravenous contrast. Multiplanar reconstructed images and MIPs were obtained and reviewed to evaluate the vascular anatomy. CONTRAST:  79m OMNIPAQUE IOHEXOL 350 MG/ML SOLN COMPARISON:  January 14, 2017. October 03, 2016 FINDINGS: CTA CHEST FINDINGS Cardiovascular: Preferential opacification of the thoracic aorta. No evidence of thoracic aortic aneurysm or dissection. Mildly enlarged heart size. No pericardial effusion. Three-vessel coronary artery atherosclerotic calcifications. Atherosclerotic calcifications of the aorta. Mediastinum/Nodes: Visualized thyroid is unremarkable. No axillary  or mediastinal adenopathy. Lungs/Pleura: No pleural effusion or pneumothorax. There is a 4 mm pulmonary nodule of the RIGHT lower lobe (series 5, image 50. There is a 3 mm subpleural nodule the RIGHT lower lobe (series 5, image 51). There is a 2 mm pulmonary nodule the RIGHT upper lobe, stable since 2018 and consistent with benign etiology (series 5, image 26). Mild centrilobular emphysema. Musculoskeletal: Remote RIGHT-sided rib fractures. Review of the MIP images confirms the above findings. CTA ABDOMEN AND PELVIS FINDINGS VASCULAR Aorta: Severe atherosclerotic calcifications throughout its course. Normal in caliber. Celiac: Patent without evidence of aneurysm, dissection, vasculitis or significant stenosis. SMA: Patent without evidence of aneurysm, dissection, vasculitis or significant stenosis. Replaced RIGHT hepatic artery. Renals: Atherosclerotic calcifications at the LEFT renal artery origin results in likely moderate stenosis. Bilateral renal arteries are patent. IMA: Patent. Inflow: Patent without evidence of aneurysm, dissection, vasculitis or significant stenosis. Veins: No obvious  venous abnormality within the limitations of this arterial phase study. Review of the MIP images confirms the above findings. NON-VASCULAR Hepatobiliary: Unremarkable for phase of contrast. Gallbladder is unremarkable. No extrahepatic biliary ductal dilation. Pancreas: Visualization of multiple coarse calcifications and diffuse pancreatic atrophy most consistent with sequela of chronic calcific pancreatitis. No peripancreatic fat stranding. Spleen: Unremarkable. Adrenals/Urinary Tract: Adrenal glands are unremarkable. Multiple bilateral renal cysts. Subcentimeter hypodense lesions are too small to accurately characterize. Kidneys enhance symmetrically. No hydronephrosis. There is a 32 mm cystic mass in the inferior pole of LEFT kidney with a possible thin internal septation, incompletely assessed (series 8, image 121; series 5, image 109). Bladder is decompressed. Stomach/Bowel: No evidence of bowel obstruction. Fluid-filled colon consistent with history of diarrhea. No ancillary evidence of appendicitis. Lymphatic: No suspicious lymphadenopathy. Reproductive: Status post hysterectomy. No adnexal masses. Other: No free air or free fluid. 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 Remote LEFT inferior pubic ramus fracture. Degenerative changes of the lumbar spine. Review of the MIP images confirms the above findings. IMPRESSION: 1. No evidence of acute aortic injury. 2. Fluid-filled colon consistent with history of diarrhea. No evidence of bowel obstruction. 3. 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 4. Scattered pulmonary nodules measuring up to 4 mm. No follow-up needed if patient is low-risk (and has no known or suspected primary neoplasm). Non-contrast chest CT can be considered in 12 months if patient is high-risk. This recommendation follows the consensus statement: Guidelines for Management of Incidental Pulmonary Nodules Detected on CT  Images: From the Fleischner Society 2017; Radiology 2017; 284:228-243. 5. There is a 32 mm cystic mass of the LEFT kidney with a likely enhancing internal septation. This is incompletely assessed and likely reflects a benign minimally complicated cyst versus a Bosniak IIF mass. Consider further evaluation with renal cyst protocol contrast enhanced MRI when clinically appropriate. The large majority of Bosniak IIF masses are benign. When malignant, nearly all are indolent. Generally, Bosniak IIF masses are followed by imaging at 6 months and 12 months (MRI preferred over CT), then annually for a total of 5 years to assess for morphologic change. (Reference: Bosniak Classification of Cystic Renal Masses, Version 2019. Radiology 2019; 292(2): [phone].) Aortic Atherosclerosis (ICD10-I70.0) and Emphysema (ICD10-J43.9). Electronically Signed   By: Valentino Saxon M.D.   On: 11/02/2020 16:39   US Abdomen Limited RUQ (LIVER/GB)  Result Date: 11/02/2020 CLINICAL DATA:  Nausea and vomiting.  Diarrhea. EXAM: ULTRASOUND ABDOMEN LIMITED RIGHT UPPER QUADRANT COMPARISON:  CT abdomen pelvis 11/02/2020 FINDINGS: Gallbladder: No gallstones or wall thickening  visualized. No sonographic Murphy sign noted by sonographer. Common bile duct: Diameter: 4 mm Liver: No focal lesion identified. Within normal limits in parenchymal echogenicity. Portal vein is patent on color Doppler imaging with normal direction of blood flow towards the liver. Other: Simple cyst noted within the right kidney measuring 4.8 x 4 x 4 cm. IMPRESSION: Unremarkable right upper quadrant ultrasound. Electronically Signed   By: Iven Finn M.D.   On: 11/02/2020 19:15     EKG: Independently reviewed, with result as described above.    Assessment/Plan     Principal Problem:   Chest pain Active Problems:   Syncope   Diabetes mellitus without complication (HCC)   Dehydration   Acute prerenal azotemia   Lactic acidosis   Generalized weakness    COPD (chronic obstructive pulmonary disease) (HCC)   Essential hypertension   DM2 (diabetes mellitus, type 2) ([place])   Acquired hypothyroidism    #) Atypical chest pain: 1 episode of sudden onset sharp, substernal chest pain radiating to the back that occurred while eating breakfast on the morning of 11/02/2020 and was reported to be nonexertional, nonpleuritic, nonreproducible direct patient interaction as well, nonpositional before spontaneously resolving 45 minutes later in the absence of any nitroglycerin, and without any subsequent recurrence.  Patient conveys a history of several prior episodes of very similar chest discomfort dating back over the last few years, but notes that today's episode was different than its predecessors in terms of the longer duration of today's episode as well as from the standpoint of new onset syncope syncope.  Unclear etiology at this time.  High-sensitivity troponin I x2 found to be very mildly elevated, but flat, with buffets noted to be 22.  EKG shows no evidence of acute ischemic changes relative to most recent prior EKG from November 2021.  In the setting of elevated blood pressure as well as radiation of chest pain back, aortic dissection was considered, although CTA chest abdomen/pelvis with dissection protocol showed absolutely no evidence of aortic dissection or aneurysm.  In the setting of chest pain and syncope differential also includes acute pulmonary embolism, although clinically, presentation appears less suggestive of this, including no overt evidence of risk factors additional associated signs or symptoms consistent with this.  As the episode occurred during the patient's breakfast, differential also includes the possibility of gastritis versus duodenitis, particularly in setting of recently completed course of doxycycline/prednisone.  No radiographic evidence of acute cholecystitis, choledocholithiasis, on CT abdomen/pelvis, and presenting liver enzymes  demonstrate no evidence of cholestatic pattern.  Will add on lipase to further evaluate.  Of note, presenting serum magnesium level 1.7, will provide supplementation to this level to goal of greater than or equal to 2.0, as further detailed below.  Plan: Continue to trend serial troponin.  Monitor on telemetry.  Echocardiogram has been ordered for the morning, with attention for interval focal wall motion abnormalities.  Add on lipase.  Repeat CMP and CBC in the morning.  As needed sublingual nitroglycerin.  Resume home atorvastatin and Lipitor.  Will provide 2 g of magnesium sulfate, with repeat serum magnesium level in the morning.      #) Syncope: 1 episode of syncope that occurred at [facility] today while the patient remained seated , and was reportedly not associated with any prodrome.  Notable in the context of her presenting episode of preceding chest discomfort, as above, with the patient reporting no prior history of syncopal event.  She certainly is at risk for orthostatic hypotension given  clinical evidence of dehydration from recent nausea/vomiting and diarrhea the above syncopal episode appears to have occurred in nonpositional scenario.   Not associated with any overt acute focal neurologic deficits. Clinically, acute ischemic stroke versus seizures appear less likely at this time.  We will continue to evaluate for ACS given preceding chest pain, including further trending of serial troponin and pursue echocardiogram morning.  In the context of no associated prodrome, there is increased risk for contributory ventricular arrhythmia.  Consequently, will strive to optimize serum magnesium level, as further detailed below.     Plan: I have placed a nursing communication order requesting that orthostatic vital signs x 1 set be checked and documented, following which will initiate gentle IV fluids in the form of normal saline at 100 cc/h. Monitor on telemetry. Monitor strict I's and O's.  Magnesium  sulfate 2 g IV every 2 hours x1 dose now, with repeat serum magnesium level ordered for the morning . check CMP, CBC, in the a.m. fall precautions ordered.  Serial troponin.  Echocardiogram in the morning.      #) Dehydration, clinical evidence consistent dehydration in the form of physical exam demonstrating the appearance of dry oral mucous membranes as well as laboratory evidence of acute prerenal azotemia as well as urinalysis demonstrating borderline elevation specific gravity, all in the context of recent increase in GI losses in the form of nausea/vomiting as well as multiple episodes of loose stool.   Plan: Normal saline at 100 cc/h overnight.  Monitor strict I's and O's Daily weights.  Repeat CMP and CBC in the morning as needed Zofran.      #) Lactic acidosis: Initial lactate noted to be 2.9, with ensuing value trending down to 2.6.  At this time, there is no evidence of underlying infectious process, including chest x-ray/CT chest showing no evidence of acute cardiopulmonary process, while COVID-19/Lenze PCR performed today were found to be negative, and urinalysis was not suggestive of UTI.  Believe patient does report to 3 episodes of loose stool over the last day, there is no evidence of colitis or additional acute intra-abdominal process on CT imaging today.  While a mild leukocytosis of 17,000 is noted, this may will be on the basis of recently completed prednisone course.  Overall, criteria for sepsis are not met at this time.  Not sepsis factors that may be contributing to presenting lactic acidosis include presence of dehydration, as further detailed above, possibility of chronic borderline elevation lactic acid in the setting of her history of stage IIIb chronic kidney disease.  There may also be generalized element of relative tissue hypoperfusion in the setting of episode of syncope earlier today.  Further evaluating for ACS, as above.  Check INR to evaluate hepatic synthetic  function.  Plan: Continuous normal saline, as above, with repeat lactic acid level ordered.  Repeat CMP and CBC in the morning.  Monitor strict I's and O's and daily weights.  Check INR.  Further evaluation management of presenting chest pain/syncope, as above.     #) Essential hypertension: Documented history of such, with the patient on lisinopril 10 mg p.o. twice daily.  Blood pressure was noted to be mildly elevated although it is noted that the Patient has not yet had her dose of lisinopril.   Plan: Resume home lisinopril.  Close monitoring of ensuing pressure via routine vital signs.  As needed order for IV hydralazine placed.     #) Generalized weakness : 5 to 6 days a generalized  weakness: 5 to 6 days of generalized weakness in the absence of any acute focal neurologic deficits to suggest acute CVA.  Suspect potential multifactorial contributions, including physiologic stress from recent diagnosis of acute bronchitis with interval improvement in preceding respiratory symptoms following course of prednisone, which may also provide independent triggering factors to her generalized weakness.  No evidence of additional/residual infectious process at this time, as further detailed above.  Plan: Check TSH, MMA a, physical therapy consult placed for the morning.  Further evaluation management of present chest pain/syncope, as above.      #) COPD: Established diagnosis in the setting of being a former smoker.  Previously on Symbicort as well as as needed albuterol as an outpatient.  No evidence to suggest acute COPD exacerbation at this time, including for evaluation of presenting VBG does not appear to demonstrate any significant uncompensated hypercapnia.   Plan: Continue home Symbicort and as needed albuterol.  Monitor on telemetry.       #) Type 2 diabetes mellitus: NPH as an outpatient, in the absence of any oral hypoglycemic agents.  Presenting blood sugar noted to be  128.  Plan: We will hold home scheduled NPH for now.  Accu-Cheks before every meal and at bedtime with low-dose sliding scale insulin.      #) Stage IIb chronic kidney disease: Associated baseline creatinine range of 1.4-1.7, presenting labs demonstrating serum creatinine consistent with this range.  Plan: Monitor strict I's and O's and daily weights.  Repeat BMP in the morning.  Tempt avoid nephrotoxic agents.     #) Acquired hypothyroidism: Documented history of such, on Synthroid as outpatient.  In the setting of her recent loose stool, will check TSH.  Plan: Check TSH, as above.  Continue home Synthroid.     DVT prophylaxis: scd's   Code Status: Full code Family Communication: Case was discussed with the patient's daughter, who is present at bedside Disposition Plan: Per Rounding Team Consults called: none;  Admission status: Med telemetry ;      Of note, this patient was added by me to the following Admit List/Treatment Team: [email or web address].    Of note, the Adult Admission Order Set (Multimorbid order set) was used by me in the admission process for this patient.   PLEASE NOTE THAT DRAGON DICTATION SOFTWARE WAS USED IN THE CONSTRUCTION OF THIS NOTE.   Rhetta Mura DO [facility] Pager [phone] From [place]   11/02/2020, 10:23 PM

## 2020-11-02 NOTE — ED Notes (Signed)
Pt got up to bedside commode with stand-by assist. Pt tolerated well

## 2020-11-02 NOTE — ED Notes (Signed)
[facility] present and report given 

## 2020-11-02 NOTE — ED Notes (Signed)
Patient much more alert and taking deeper breaths. Placed back on [place]. Currently on 3LNC, SAT 100%

## 2020-11-03 ENCOUNTER — Inpatient Hospital Stay (HOSPITAL_BASED_OUTPATIENT_CLINIC_OR_DEPARTMENT_OTHER): Payer: PPO

## 2020-11-03 DIAGNOSIS — R531 Weakness: Secondary | ICD-10-CM | POA: Diagnosis not present

## 2020-11-03 DIAGNOSIS — E039 Hypothyroidism, unspecified: Secondary | ICD-10-CM | POA: Diagnosis present

## 2020-11-03 DIAGNOSIS — E872 Acidosis, unspecified: Secondary | ICD-10-CM | POA: Diagnosis present

## 2020-11-03 DIAGNOSIS — N19 Unspecified kidney failure: Secondary | ICD-10-CM | POA: Diagnosis present

## 2020-11-03 DIAGNOSIS — R0789 Other chest pain: Secondary | ICD-10-CM

## 2020-11-03 DIAGNOSIS — R55 Syncope and collapse: Secondary | ICD-10-CM

## 2020-11-03 DIAGNOSIS — E860 Dehydration: Secondary | ICD-10-CM | POA: Diagnosis not present

## 2020-11-03 DIAGNOSIS — J449 Chronic obstructive pulmonary disease, unspecified: Secondary | ICD-10-CM | POA: Diagnosis not present

## 2020-11-03 DIAGNOSIS — E119 Type 2 diabetes mellitus without complications: Secondary | ICD-10-CM

## 2020-11-03 DIAGNOSIS — R079 Chest pain, unspecified: Secondary | ICD-10-CM | POA: Diagnosis not present

## 2020-11-03 DIAGNOSIS — I10 Essential (primary) hypertension: Secondary | ICD-10-CM | POA: Diagnosis not present

## 2020-11-03 DIAGNOSIS — Z794 Long term (current) use of insulin: Secondary | ICD-10-CM | POA: Diagnosis not present

## 2020-11-03 LAB — CBC WITH DIFFERENTIAL/PLATELET
Abs Immature Granulocytes: 0.23 10*3/uL — ABNORMAL HIGH (ref 0.00–0.07)
Basophils Absolute: 0.1 10*3/uL (ref 0.0–0.1)
Basophils Relative: 1 %
Eosinophils Absolute: 0.3 10*3/uL (ref 0.0–0.5)
Eosinophils Relative: 2 %
HCT: 34.5 % — ABNORMAL LOW (ref 36.0–46.0)
Hemoglobin: 11.1 g/dL — ABNORMAL LOW (ref 12.0–15.0)
Immature Granulocytes: 2 %
Lymphocytes Relative: 20 %
Lymphs Abs: 2.6 10*3/uL (ref 0.7–4.0)
MCH: 29.4 pg (ref 26.0–34.0)
MCHC: 32.2 g/dL (ref 30.0–36.0)
MCV: 91.5 fL (ref 80.0–100.0)
Monocytes Absolute: 1.2 10*3/uL — ABNORMAL HIGH (ref 0.1–1.0)
Monocytes Relative: 9 %
Neutro Abs: 9.1 10*3/uL — ABNORMAL HIGH (ref 1.7–7.7)
Neutrophils Relative %: 66 %
Platelets: 294 10*3/uL (ref 150–400)
RBC: 3.77 MIL/uL — ABNORMAL LOW (ref 3.87–5.11)
RDW: 13.7 % (ref 11.5–15.5)
WBC: 13.5 10*3/uL — ABNORMAL HIGH (ref 4.0–10.5)
nRBC: 0 % (ref 0.0–0.2)

## 2020-11-03 LAB — COMPREHENSIVE METABOLIC PANEL
ALT: 13 U/L (ref 0–44)
AST: 19 U/L (ref 15–41)
Albumin: 3.2 g/dL — ABNORMAL LOW (ref 3.5–5.0)
Alkaline Phosphatase: 76 U/L (ref 38–126)
Anion gap: 8 (ref 5–15)
BUN: 43 mg/dL — ABNORMAL HIGH (ref 8–23)
CO2: 21 mmol/L — ABNORMAL LOW (ref 22–32)
Calcium: 8.9 mg/dL (ref 8.9–10.3)
Chloride: 112 mmol/L — ABNORMAL HIGH (ref 98–111)
Creatinine, Ser: 1.18 mg/dL — ABNORMAL HIGH (ref 0.44–1.00)
GFR, Estimated: 47 mL/min — ABNORMAL LOW (ref >60)
Glucose, Bld: 73 mg/dL (ref 70–99)
Potassium: 3.5 mmol/L (ref 3.5–5.1)
Sodium: 141 mmol/L (ref 135–145)
Total Bilirubin: 0.4 mg/dL (ref 0.3–1.2)
Total Protein: 6.6 g/dL (ref 6.5–8.1)

## 2020-11-03 LAB — C DIFFICILE QUICK SCREEN W PCR REFLEX
C Diff antigen: NEGATIVE
C Diff interpretation: NOT DETECTED
C Diff toxin: NEGATIVE

## 2020-11-03 LAB — ECHOCARDIOGRAM COMPLETE
Area-P 1/2: 3.77 cm2
Calc EF: 55.6 %
Height: 64 in
MV M vel: 5.42 m/s
MV Peak grad: 117.5 mmHg
Radius: 0.2 cm
S' Lateral: 2.8 cm
Single Plane A2C EF: 43.9 %
Single Plane A4C EF: 65.5 %
Weight: 2024.7 oz

## 2020-11-03 LAB — LACTIC ACID, PLASMA: Lactic Acid, Venous: 1.1 mmol/L (ref 0.5–1.9)

## 2020-11-03 LAB — I-STAT VENOUS BLOOD GAS, ED
Acid-base deficit: 5 mmol/L — ABNORMAL HIGH (ref 0.0–2.0)
Bicarbonate: 22.1 mmol/L (ref 20.0–28.0)
Calcium, Ion: 1.3 mmol/L (ref 1.15–1.40)
HCT: 37 % (ref 36.0–46.0)
Hemoglobin: 12.6 g/dL (ref 12.0–15.0)
O2 Saturation: 49 %
Patient temperature: 96.7
Potassium: 4 mmol/L (ref 3.5–5.1)
Sodium: 142 mmol/L (ref 135–145)
TCO2: 23 mmol/L (ref 22–32)
pCO2, Ven: 44 mmHg (ref 44.0–60.0)
pH, Ven: 7.303 (ref 7.250–7.430)
pO2, Ven: 27 mmHg — CL (ref 32.0–45.0)

## 2020-11-03 LAB — PROTIME-INR
INR: 1.1 (ref 0.8–1.2)
Prothrombin Time: 14 seconds (ref 11.4–15.2)

## 2020-11-03 LAB — GLUCOSE, CAPILLARY
Glucose-Capillary: 81 mg/dL (ref 70–99)
Glucose-Capillary: 93 mg/dL (ref 70–99)
Glucose-Capillary: 180 mg/dL — ABNORMAL HIGH (ref 70–99)

## 2020-11-03 LAB — TSH: TSH: 4.703 u[IU]/mL — ABNORMAL HIGH (ref 0.350–4.500)

## 2020-11-03 LAB — LIPASE, BLOOD: Lipase: 27 U/L (ref 11–51)

## 2020-11-03 LAB — MAGNESIUM: Magnesium: 2.6 mg/dL — ABNORMAL HIGH (ref 1.7–2.4)

## 2020-11-03 LAB — TROPONIN I (HIGH SENSITIVITY): Troponin I (High Sensitivity): 25 ng/L — ABNORMAL HIGH (ref <18)

## 2020-11-03 NOTE — Progress Notes (Signed)
  Echocardiogram 2D Echocardiogram has been performed.  Lisa Nelson 11/03/2020, 12:01 PM

## 2020-11-03 NOTE — Care Management Obs Status (Signed)
[facility] NOTIFICATION   Patient Details  Name: Lisa Nelson MRN: [id number] Date of Birth: [birth year]   Medicare Observation Status Notification Given:  Yes    Cecil Cobbs 11/03/2020, 2:11 PM

## 2020-11-03 NOTE — Discharge Summary (Signed)
Physician Discharge Summary  Lisa Nelson UTM:[id number] DOB: 1940-03-23 DOA: 11/02/2020  PCP: Rubie Maid, MD  Admit date: 11/02/2020 Discharge date: 11/03/2020  Admitted From: Home Disposition: Home  Recommendations for Outpatient Follow-up:  Follow up with PCP in 1-2 weeks Please obtain BMP/CBC in one week Please follow up with cardiology at [place] this week  Home Health: None Equipment/Devices: None  Discharge Condition: Stable CODE STATUS: Full Diet recommendation: As tolerated  Brief/Interim Summary: Lisa Nelson is a 80 y.o. female with medical history significant for COPD, essential hypertension, type 2 diabetes mellitus, stage IIIb chronic kidney disease with baseline creatinine 1.4-1.7, who is admitted from [place] for further evaluation atypical recurrent chest pain, nausea with episode of syncope.  Troponin minimally elevated but symptoms resolved, patient has advanced dementia, majority of history from daughter at bedside, lengthy discussion about goals of care, at this time given resolution of symptoms, negative orthostatic vital signs we discussed further work-up in the outpatient setting with patient's cardiologist at [facility].  At this time no further indication for inpatient work-up, we discussed possible discontinuation of home anti hypertensive medications given patient's symptoms appear to occur soon after ambulating from a seated position although orthostatics here appear to be negative.  Patient had previously worn a 30-day Holter monitor last year for similar episodes, we discussed following up with cardiology for repeat Holter monitor given patient has had multiple episodes over the past few weeks, daughter hopeful that Holter monitor will be able to shed some light on patient's recurrent transient symptoms.  Patient otherwise stable family agreeable for discharge home with further outpatient work-up given negative findings here, resolution of  symptoms and otherwise stable chronic conditions.  Atypical chest pain Syncope, questionably orthostatic  AKI on CKD2b in the setting of hypovolemia/dehydration, resolved Lactic acidosis, resolved HTN, essential  Weakness/Debility, chronic, generalized without deficit COPD not in exacerbation IDDM2 Hypothyroidism, acquired  Discharge Instructions  Discharge Instructions     Call MD for:  persistant dizziness or light-headedness   Complete by: As directed    Diet - low sodium heart healthy   Complete by: As directed    Increase activity slowly   Complete by: As directed       Allergies as of 11/03/2020   No Known Allergies      Medication List     STOP taking these medications    ibuprofen 600 MG tablet Commonly known as: ADVIL       TAKE these medications    albuterol 108 (90 Base) MCG/ACT inhaler Commonly known as: VENTOLIN HFA Inhale 2 puffs into the lungs every 6 (six) hours as needed for wheezing.   azelastine 0.1 % nasal spray Commonly known as: ASTELIN Place 1 spray into both nostrils 2 (two) times daily.   budesonide-formoterol 160-4.5 MCG/ACT inhaler Commonly known as: SYMBICORT Inhale 2 puffs into the lungs in the morning and at bedtime.   donepezil 10 MG tablet Commonly known as: ARICEPT Take 10 mg by mouth at bedtime.   hydrOXYzine 25 MG tablet Commonly known as: ATARAX/VISTARIL 1/2 or 1 tab prn anxiety   insulin NPH Human 100 UNIT/ML injection Commonly known as: NOVOLIN N Inject 10-15 Units into the skin daily.   levothyroxine 75 MCG tablet Commonly known as: SYNTHROID Take 75 mcg by mouth daily before breakfast.   lidocaine 5 % Commonly known as: Lidoderm Place 1 patch onto the skin daily. Remove & Discard patch within 12 hours or as directed by MD  lisinopril 10 MG tablet Commonly known as: ZESTRIL Take 10 mg by mouth in the morning and at bedtime.   magnesium oxide 400 MG tablet Commonly known as: MAG-OX Take 400 mg by  mouth daily.   memantine 10 MG tablet Commonly known as: NAMENDA Take 10 mg by mouth 2 (two) times daily.   oxyCODONE 5 MG immediate release tablet Commonly known as: Roxicodone Take 1 tablet (5 mg total) by mouth every 4 (four) hours as needed for severe pain.   sertraline 50 MG tablet Commonly known as: ZOLOFT Take 1.5-2 tablets by mouth daily.   Vitamin D-1000 Max St 25 MCG (1000 UT) tablet Generic drug: Cholecalciferol Take 1,000 Units by mouth daily.        No Known Allergies  Consultations: None  Procedures/Studies: DG Chest Portable 1 View  Result Date: 11/02/2020 CLINICAL DATA:  Chest pain and back pain for 1 hour. EXAM: PORTABLE CHEST 1 VIEW COMPARISON:  Chest radiograph dated 11/28/2019. FINDINGS: The heart size is normal. Vascular calcifications are seen in the aortic arch. Both lungs are clear. Chronic deformity of multiple right-sided ribs is redemonstrated. Degenerative changes are seen in the spine. IMPRESSION: No active disease. Aortic Atherosclerosis (ICD10-I70.0). Electronically Signed   By: Zerita Boers M.D.   On: 11/02/2020 14:46   CT Angio Chest/Abd/Pel for Dissection W and/or Wo Contrast  Result Date: 11/02/2020 CLINICAL DATA:  chest pain, n/v, back pain, unequal pulses EXAM: CT ANGIOGRAPHY CHEST, ABDOMEN AND PELVIS TECHNIQUE: Non-contrast CT of the chest was initially obtained. Multidetector CT imaging through the chest, abdomen and pelvis was performed using the standard protocol during bolus administration of intravenous contrast. Multiplanar reconstructed images and MIPs were obtained and reviewed to evaluate the vascular anatomy. CONTRAST:  7mL OMNIPAQUE IOHEXOL 350 MG/ML SOLN COMPARISON:  January 14, 2017. October 03, 2016 FINDINGS: CTA CHEST FINDINGS Cardiovascular: Preferential opacification of the thoracic aorta. No evidence of thoracic aortic aneurysm or dissection. Mildly enlarged heart size. No pericardial effusion. Three-vessel coronary artery  atherosclerotic calcifications. Atherosclerotic calcifications of the aorta. Mediastinum/Nodes: Visualized thyroid is unremarkable. No axillary or mediastinal adenopathy. Lungs/Pleura: No pleural effusion or pneumothorax. There is a 4 mm pulmonary nodule of the RIGHT lower lobe (series 5, image 50. There is a 3 mm subpleural nodule the RIGHT lower lobe (series 5, image 51). There is a 2 mm pulmonary nodule the RIGHT upper lobe, stable since 2018 and consistent with benign etiology (series 5, image 26). Mild centrilobular emphysema. Musculoskeletal: Remote RIGHT-sided rib fractures. Review of the MIP images confirms the above findings. CTA ABDOMEN AND PELVIS FINDINGS VASCULAR Aorta: Severe atherosclerotic calcifications throughout its course. Normal in caliber. Celiac: Patent without evidence of aneurysm, dissection, vasculitis or significant stenosis. SMA: Patent without evidence of aneurysm, dissection, vasculitis or significant stenosis. Replaced RIGHT hepatic artery. Renals: Atherosclerotic calcifications at the LEFT renal artery origin results in likely moderate stenosis. Bilateral renal arteries are patent. IMA: Patent. Inflow: Patent without evidence of aneurysm, dissection, vasculitis or significant stenosis. Veins: No obvious venous abnormality within the limitations of this arterial phase study. Review of the MIP images confirms the above findings. NON-VASCULAR Hepatobiliary: Unremarkable for phase of contrast. Gallbladder is unremarkable. No extrahepatic biliary ductal dilation. Pancreas: Visualization of multiple coarse calcifications and diffuse pancreatic atrophy most consistent with sequela of chronic calcific pancreatitis. No peripancreatic fat stranding. Spleen: Unremarkable. Adrenals/Urinary Tract: Adrenal glands are unremarkable. Multiple bilateral renal cysts. Subcentimeter hypodense lesions are too small to accurately characterize. Kidneys enhance symmetrically. No hydronephrosis. There is a 32  mm cystic mass in the inferior pole of LEFT kidney with a possible thin internal septation, incompletely assessed (series 8, image 121; series 5, image 109). Bladder is decompressed. Stomach/Bowel: No evidence of bowel obstruction. Fluid-filled colon consistent with history of diarrhea. No ancillary evidence of appendicitis. Lymphatic: No suspicious lymphadenopathy. Reproductive: Status post hysterectomy. No adnexal masses. Other: No free air or free fluid. 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 Remote LEFT inferior pubic ramus fracture. Degenerative changes of the lumbar spine. Review of the MIP images confirms the above findings. IMPRESSION: 1. No evidence of acute aortic injury. 2. Fluid-filled colon consistent with history of diarrhea. No evidence of bowel obstruction. 3. 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 4. Scattered pulmonary nodules measuring up to 4 mm. No follow-up needed if patient is low-risk (and has no known or suspected primary neoplasm). Non-contrast chest CT can be considered in 12 months if patient is high-risk. This recommendation follows the consensus statement: Guidelines for Management of Incidental Pulmonary Nodules Detected on CT Images: From the Fleischner Society 2017; Radiology 2017; 284:228-243. 5. There is a 32 mm cystic mass of the LEFT kidney with a likely enhancing internal septation. This is incompletely assessed and likely reflects a benign minimally complicated cyst versus a Bosniak IIF mass. Consider further evaluation with renal cyst protocol contrast enhanced MRI when clinically appropriate. The large majority of Bosniak IIF masses are benign. When malignant, nearly all are indolent. Generally, Bosniak IIF masses are followed by imaging at 6 months and 12 months (MRI preferred over CT), then annually for a total of 5 years to assess for morphologic change. (Reference: Bosniak Classification of Cystic  Renal Masses, Version 2019. Radiology 2019; 292(2): [phone].) Aortic Atherosclerosis (ICD10-I70.0) and Emphysema (ICD10-J43.9). Electronically Signed   By: Valentino Saxon M.D.   On: 11/02/2020 16:39   US Abdomen Limited RUQ (LIVER/GB)  Result Date: 11/02/2020 CLINICAL DATA:  Nausea and vomiting.  Diarrhea. EXAM: ULTRASOUND ABDOMEN LIMITED RIGHT UPPER QUADRANT COMPARISON:  CT abdomen pelvis 11/02/2020 FINDINGS: Gallbladder: No gallstones or wall thickening visualized. No sonographic Murphy sign noted by sonographer. Common bile duct: Diameter: 4 mm Liver: No focal lesion identified. Within normal limits in parenchymal echogenicity. Portal vein is patent on color Doppler imaging with normal direction of blood flow towards the liver. Other: Simple cyst noted within the right kidney measuring 4.8 x 4 x 4 cm. IMPRESSION: Unremarkable right upper quadrant ultrasound. Electronically Signed   By: Iven Finn M.D.   On: 11/02/2020 19:15     Subjective: No acute issues or events overnight denies nausea vomiting diarrhea constipation headache fevers chills chest pain shortness of breath   Discharge Exam: Vitals:   11/03/20 1028 11/03/20 1029  BP: (!) 142/69 (!) 160/76  Pulse: 86 87  Resp:    Temp:    SpO2: 100% 100%   Vitals:   11/03/20 1025 11/03/20 1026 11/03/20 1028 11/03/20 1029  BP: (!) 165/62 (!) 159/64 (!) 142/69 (!) 160/76  Pulse: 72 71 86 87  Resp:      Temp:      TempSrc:      SpO2: 97% 97% 100% 100%  Weight:      Height:        General: Pt is alert, awake, not in acute distress Cardiovascular: RRR, S1/S2 +, no rubs, no gallops Respiratory: CTA bilaterally, no wheezing, no rhonchi Abdominal: Soft, NT, ND, bowel sounds + Extremities: no edema, no cyanosis    The results of  significant diagnostics from this hospitalization (including imaging, microbiology, ancillary and laboratory) are listed below for reference.     Microbiology: Recent Results (from the past 240  hour(s))  Blood culture (routine x 2)     Status: None (Preliminary result)   Collection Time: 11/02/20  2:43 PM   Specimen: Right Antecubital; Blood  Result Value Ref Range Status   Specimen Description   Final    RIGHT ANTECUBITAL BLOOD Performed at [facility], [address] [place], [place] [address]    Special Requests   Final    Blood Culture results may not be optimal due to an excessive volume of blood received in culture bottles [place] Performed at [facility], [address] [place], [place] [address]    Culture   Final    NO GROWTH < 12 HOURS Performed at [facility], [place] [address], [place], [place] [address]    Report Status PENDING  Incomplete  Resp Panel by RT-PCR (Flu A&B, Covid) Nasopharyngeal Swab     Status: None   Collection Time: 11/02/20  3:00 PM   Specimen: Nasopharyngeal Swab; Nasopharyngeal(NP) swabs in vial transport medium  Result Value Ref Range Status   SARS Coronavirus 2 by RT PCR NEGATIVE NEGATIVE Final    Comment: (NOTE) SARS-CoV-2 target nucleic acids are NOT DETECTED.  The SARS-CoV-2 RNA is generally detectable in upper respiratory specimens during the acute phase of infection. The lowest concentration of SARS-CoV-2 viral copies this assay can detect is 138 copies/mL. A negative result does not preclude SARS-Cov-2 infection and should not be used as the sole basis for treatment or other patient management decisions. A negative result may occur with  improper specimen collection/handling, submission of specimen other than nasopharyngeal swab, presence of viral mutation(s) within the areas targeted by this assay, and inadequate number of viral copies(<138 copies/mL). A negative result must be combined with clinical observations, patient history, and epidemiological information. The expected result is Negative.  Fact Sheet for Patients:  EntrepreneurPulse.com.au  Fact Sheet  for Healthcare Providers:  IncredibleEmployment.be  This test is no t yet approved or cleared by the [place] FDA and  has been authorized for detection and/or diagnosis of SARS-CoV-2 by FDA under an Emergency Use Authorization (EUA). This EUA will remain  in effect (meaning this test can be used) for the duration of the COVID-19 declaration under Section 564(b)(1) of the Act, 21 U.S.C.section 360bbb-3(b)(1), unless the authorization is terminated  or revoked sooner.       Influenza A by PCR NEGATIVE NEGATIVE Final   Influenza B by PCR NEGATIVE NEGATIVE Final    Comment: (NOTE) The Xpert Xpress SARS-CoV-2/FLU/RSV plus assay is intended as an aid in the diagnosis of influenza from Nasopharyngeal swab specimens and should not be used as a sole basis for treatment. Nasal washings and aspirates are unacceptable for Xpert Xpress SARS-CoV-2/FLU/RSV testing.  Fact Sheet for Patients: EntrepreneurPulse.com.au  Fact Sheet for Healthcare Providers: IncredibleEmployment.be  This test is not yet approved or cleared by the [place] FDA and has been authorized for detection and/or diagnosis of SARS-CoV-2 by FDA under an Emergency Use Authorization (EUA). This EUA will remain in effect (meaning this test can be used) for the duration of the COVID-19 declaration under Section 564(b)(1) of the Act, 21 U.S.C. section 360bbb-3(b)(1), unless the authorization is terminated or revoked.  Performed at [facility], [address] [place], [place] [address]  Blood culture (routine x 2)     Status: None (Preliminary result)   Collection Time: 11/02/20  3:23 PM   Specimen: Left Antecubital; Blood  Result Value Ref Range Status   Specimen Description   Final    LEFT ANTECUBITAL BLOOD Performed at [facility], [address] [place], [place] [address]    Special Requests   Final    Blood Culture adequate  volume BOTTLES DRAWN AEROBIC AND ANAEROBIC Performed at [facility], [address] [place], [place] [address]    Culture   Final    NO GROWTH < 12 HOURS Performed at [facility], [place] [address], [place], [place] [address]    Report Status PENDING  Incomplete  C Difficile Quick Screen w PCR reflex     Status: None   Collection Time: 11/02/20  7:10 PM   Specimen: STOOL  Result Value Ref Range Status   C Diff antigen NEGATIVE NEGATIVE Final   C Diff toxin NEGATIVE NEGATIVE Final   C Diff interpretation No C. difficile detected.  Final    Comment: NEGATIVE Performed at [facility], [place] [address] [place], [place] [address]      Labs: BNP (last 3 results) No results for input(s): BNP in the last 8760 hours. Basic Metabolic Panel: Recent Labs  Lab 11/02/20 1443 11/02/20 2251 11/03/20 0459  NA 139  --  141  K 4.0  --  3.5  CL 107  --  112*  CO2 21*  --  21*  GLUCOSE 128*  --  73  BUN 52*  --  43*  CREATININE 1.48*  --  1.18*  CALCIUM 9.8  --  8.9  MG  --  1.7 2.6*   Liver Function Tests: Recent Labs  Lab 11/02/20 1443 11/03/20 0459  AST 21 19  ALT 16 13  ALKPHOS 88 76  BILITOT 0.5 0.4  PROT 7.5 6.6  ALBUMIN 3.8 3.2*   Recent Labs  Lab 11/02/20 1443 11/03/20 0459  LIPASE 40 27   No results for input(s): AMMONIA in the last 168 hours. CBC: Recent Labs  Lab 11/02/20 1443 11/03/20 0459  WBC 17.4* 13.5*  NEUTROABS 11.9* 9.1*  HGB 12.3 11.1*  HCT 38.2 34.5*  MCV 91.2 91.5  PLT 344 294   Cardiac Enzymes: No results for input(s): CKTOTAL, CKMB, CKMBINDEX, TROPONINI in the last 168 hours. BNP: Invalid input(s): POCBNP CBG: Recent Labs  Lab 11/02/20 1719 11/02/20 2333 11/03/20 0442 11/03/20 0835 11/03/20 1218  GLUCAP 101* 83 81 93 180*   D-Dimer No results for input(s): DDIMER in the last 72 hours. Hgb A1c No results for input(s): HGBA1C in the last 72 hours. Lipid Profile No results for input(s): CHOL, HDL, LDLCALC,  TRIG, CHOLHDL, LDLDIRECT in the last 72 hours. Thyroid function studies Recent Labs    11/03/20 0459  TSH 4.703*   Anemia work up No results for input(s): VITAMINB12, FOLATE, FERRITIN, TIBC, IRON, RETICCTPCT in the last 72 hours. Urinalysis    Component Value Date/Time   COLORURINE YELLOW 11/02/2020 1523   APPEARANCEUR CLEAR 11/02/2020 1523   LABSPEC 1.020 11/02/2020 1523   PHURINE 5.5 11/02/2020 1523   GLUCOSEU NEGATIVE 11/02/2020 1523   HGBUR TRACE (A) 11/02/2020 1523   BILIRUBINUR NEGATIVE 11/02/2020 1523   KETONESUR NEGATIVE 11/02/2020 1523   PROTEINUR >300 (A) 11/02/2020 1523   NITRITE NEGATIVE 11/02/2020 1523   LEUKOCYTESUR NEGATIVE 11/02/2020 1523   Sepsis Labs Invalid input(s):  PROCALCITONIN,  WBC,  LACTICIDVEN Microbiology Recent Results (from the past 240 hour(s))  Blood culture (routine x 2)     Status: None (Preliminary result)   Collection Time: 11/02/20  2:43 PM   Specimen: Right Antecubital; Blood  Result Value Ref Range Status   Specimen Description   Final    RIGHT ANTECUBITAL BLOOD Performed at [facility], [address] [place], [place] [address]    Special Requests   Final    Blood Culture results may not be optimal due to an excessive volume of blood received in culture bottles [place] Performed at [facility], [address] , [place] [address]    Culture   Final    NO GROWTH < 12 HOURS Performed at [facility], [place] [address] [place], [place] [address]    Report Status PENDING  Incomplete  Resp Panel by RT-PCR (Flu A&B, Covid) Nasopharyngeal Swab     Status: None   Collection Time: 11/02/20  3:00 PM   Specimen: Nasopharyngeal Swab; Nasopharyngeal(NP) swabs in vial transport medium  Result Value Ref Range Status   SARS Coronavirus 2 by RT PCR NEGATIVE NEGATIVE Final    Comment: (NOTE) SARS-CoV-2 target nucleic acids are NOT DETECTED.  The SARS-CoV-2 RNA is generally detectable in  upper respiratory specimens during the acute phase of infection. The lowest concentration of SARS-CoV-2 viral copies this assay can detect is 138 copies/mL. A negative result does not preclude SARS-Cov-2 infection and should not be used as the sole basis for treatment or other patient management decisions. A negative result may occur with  improper specimen collection/handling, submission of specimen other than nasopharyngeal swab, presence of viral mutation(s) within the areas targeted by this assay, and inadequate number of viral copies(<138 copies/mL). A negative result must be combined with clinical observations, patient history, and epidemiological information. The expected result is Negative.  Fact Sheet for Patients:  EntrepreneurPulse.com.au  Fact Sheet for Healthcare Providers:  IncredibleEmployment.be  This test is no t yet approved or cleared by the [place] FDA and  has been authorized for detection and/or diagnosis of SARS-CoV-2 by FDA under an Emergency Use Authorization (EUA). This EUA will remain  in effect (meaning this test can be used) for the duration of the COVID-19 declaration under Section 564(b)(1) of the Act, 21 U.S.C.section 360bbb-3(b)(1), unless the authorization is terminated  or revoked sooner.       Influenza A by PCR NEGATIVE NEGATIVE Final   Influenza B by PCR NEGATIVE NEGATIVE Final    Comment: (NOTE) The Xpert Xpress SARS-CoV-2/FLU/RSV plus assay is intended as an aid in the diagnosis of influenza from Nasopharyngeal swab specimens and should not be used as a sole basis for treatment. Nasal washings and aspirates are unacceptable for Xpert Xpress SARS-CoV-2/FLU/RSV testing.  Fact Sheet for Patients: EntrepreneurPulse.com.au  Fact Sheet for Healthcare Providers: IncredibleEmployment.be  This test is not yet approved or cleared by the [place] FDA and has been  authorized for detection and/or diagnosis of SARS-CoV-2 by FDA under an Emergency Use Authorization (EUA). This EUA will remain in effect (meaning this test can be used) for the duration of the COVID-19 declaration under Section 564(b)(1) of the Act, 21 U.S.C. section 360bbb-3(b)(1), unless the authorization is terminated or revoked.  Performed at [facility], [address] [place], [place] [address]   Blood culture (routine x 2)     Status: None (Preliminary result)  Collection Time: 11/02/20  3:23 PM   Specimen: Left Antecubital; Blood  Result Value Ref Range Status   Specimen Description   Final    LEFT ANTECUBITAL BLOOD Performed at [facility], [address] [place], [place] [address]    Special Requests   Final    Blood Culture adequate volume BOTTLES DRAWN AEROBIC AND ANAEROBIC Performed at [facility], [address] [place], [place] [address]    Culture   Final    NO GROWTH < 12 HOURS Performed at [facility], [place] [address] [place], [place] [address]    Report Status PENDING  Incomplete  C Difficile Quick Screen w PCR reflex     Status: None   Collection Time: 11/02/20  7:10 PM   Specimen: STOOL  Result Value Ref Range Status   C Diff antigen NEGATIVE NEGATIVE Final   C Diff toxin NEGATIVE NEGATIVE Final   C Diff interpretation No C. difficile detected.  Final    Comment: NEGATIVE Performed at [facility], [place] [address] [place], [place] [address]      Time coordinating discharge: Over 30 minutes  SIGNED:   Little Ishikawa, DO [facility] 11/03/2020, 12:33 PM Pager   If 7PM-7AM, please contact night-coverage [email or web address]

## 2020-11-03 NOTE — Care Management CC44 (Signed)
Condition Code 44 Documentation Completed  Patient Details  Name: Lisa Nelson MRN: [id number] Date of Birth: 04/18/1940   Condition Code 44 given:  Yes Patient signature on Condition Code 44 notice:  Yes Documentation of 2 MD's agreement:  Yes Code 44 added to claim:  Yes    Ross Ludwig, LCSW 11/03/2020, 2:11 PM

## 2020-11-04 LAB — URINE CULTURE: Culture: <10000 — AB

## 2020-11-07 LAB — CULTURE, BLOOD (ROUTINE X 2)
Culture: NO GROWTH
Culture: NO GROWTH
Special Requests: ADEQUATE

## 2020-11-12 DIAGNOSIS — R55 Syncope and collapse: Secondary | ICD-10-CM | POA: Diagnosis not present

## 2020-11-12 DIAGNOSIS — Z09 Encounter for follow-up examination after completed treatment for conditions other than malignant neoplasm: Secondary | ICD-10-CM | POA: Diagnosis not present

## 2020-11-12 DIAGNOSIS — L989 Disorder of the skin and subcutaneous tissue, unspecified: Secondary | ICD-10-CM | POA: Diagnosis not present

## 2020-11-12 DIAGNOSIS — I10 Essential (primary) hypertension: Secondary | ICD-10-CM | POA: Diagnosis not present

## 2020-11-12 DIAGNOSIS — I5181 Takotsubo syndrome: Secondary | ICD-10-CM | POA: Diagnosis not present

## 2020-11-12 DIAGNOSIS — J449 Chronic obstructive pulmonary disease, unspecified: Secondary | ICD-10-CM | POA: Diagnosis not present

## 2020-11-12 DIAGNOSIS — I493 Ventricular premature depolarization: Secondary | ICD-10-CM | POA: Diagnosis not present

## 2020-11-13 LAB — METHYLMALONIC ACID, SERUM: Methylmalonic Acid, Quantitative: 212 nmol/L (ref 0–378)

## 2020-12-13 DIAGNOSIS — I129 Hypertensive chronic kidney disease with stage 1 through stage 4 chronic kidney disease, or unspecified chronic kidney disease: Secondary | ICD-10-CM | POA: Diagnosis not present

## 2020-12-13 DIAGNOSIS — E039 Hypothyroidism, unspecified: Secondary | ICD-10-CM | POA: Diagnosis not present

## 2020-12-13 DIAGNOSIS — E1122 Type 2 diabetes mellitus with diabetic chronic kidney disease: Secondary | ICD-10-CM | POA: Diagnosis not present

## 2020-12-13 DIAGNOSIS — E1140 Type 2 diabetes mellitus with diabetic neuropathy, unspecified: Secondary | ICD-10-CM | POA: Diagnosis not present

## 2020-12-13 DIAGNOSIS — Z794 Long term (current) use of insulin: Secondary | ICD-10-CM | POA: Diagnosis not present

## 2020-12-13 DIAGNOSIS — N189 Chronic kidney disease, unspecified: Secondary | ICD-10-CM | POA: Diagnosis not present

## 2020-12-13 DIAGNOSIS — Z87891 Personal history of nicotine dependence: Secondary | ICD-10-CM | POA: Diagnosis not present

## 2020-12-20 DIAGNOSIS — D649 Anemia, unspecified: Secondary | ICD-10-CM | POA: Diagnosis not present

## 2020-12-20 DIAGNOSIS — J449 Chronic obstructive pulmonary disease, unspecified: Secondary | ICD-10-CM | POA: Diagnosis not present

## 2020-12-20 DIAGNOSIS — B351 Tinea unguium: Secondary | ICD-10-CM | POA: Diagnosis not present

## 2020-12-20 DIAGNOSIS — I10 Essential (primary) hypertension: Secondary | ICD-10-CM | POA: Diagnosis not present

## 2020-12-20 DIAGNOSIS — L84 Corns and callosities: Secondary | ICD-10-CM | POA: Diagnosis not present

## 2020-12-20 DIAGNOSIS — M79672 Pain in left foot: Secondary | ICD-10-CM | POA: Diagnosis not present

## 2020-12-20 DIAGNOSIS — I493 Ventricular premature depolarization: Secondary | ICD-10-CM | POA: Diagnosis not present

## 2020-12-20 DIAGNOSIS — E119 Type 2 diabetes mellitus without complications: Secondary | ICD-10-CM | POA: Diagnosis not present

## 2020-12-20 DIAGNOSIS — M79671 Pain in right foot: Secondary | ICD-10-CM | POA: Diagnosis not present

## 2020-12-20 DIAGNOSIS — I5181 Takotsubo syndrome: Secondary | ICD-10-CM | POA: Diagnosis not present

## 2021-07-19 IMAGING — DX DG CHEST 1V PORT
1 series · 1 of 1 positions shown · non-contrast
Comparison: 08/10/2019

CLINICAL DATA: Chest pain. Chest pain and UPPER back pain. Nausea,
weakness.

EXAM:
PORTABLE CHEST 1 VIEW

[chest ap]
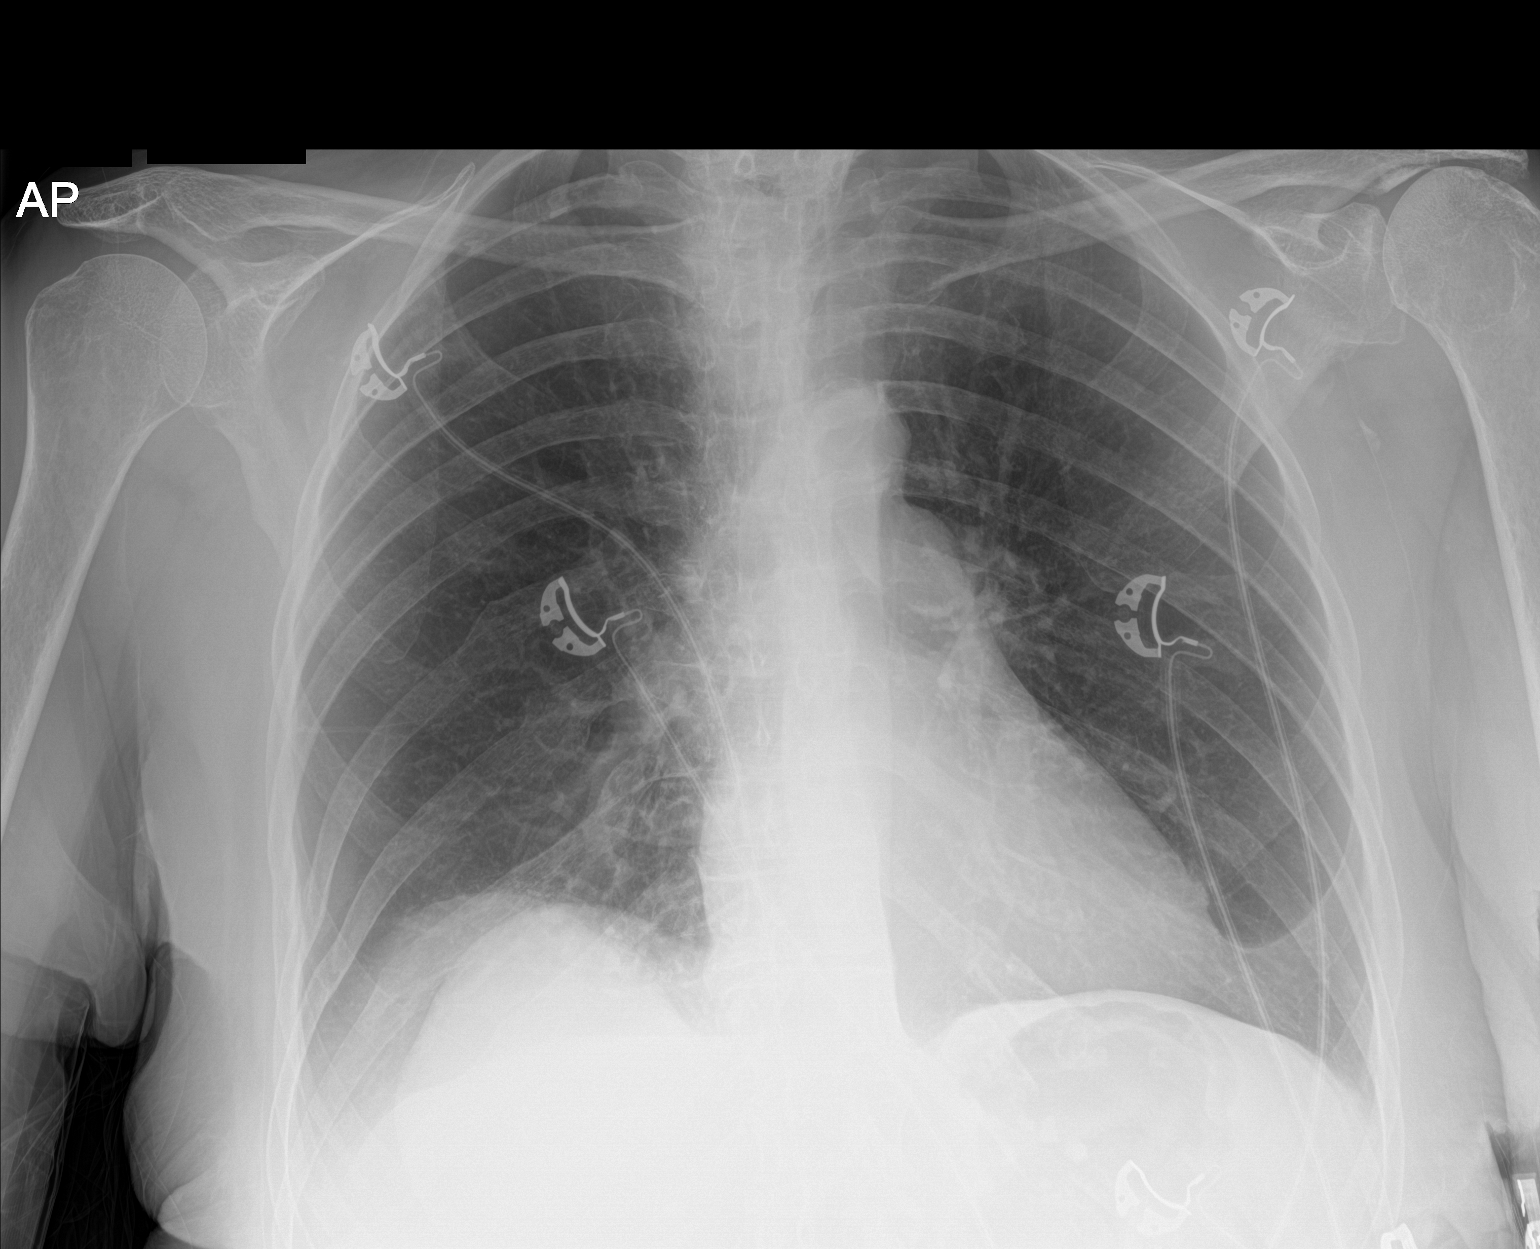

[1 of 1 positions shown; findings below may reference images not displayed]

FINDINGS: Heart size is normal. Lungs are free of focal consolidations and
pleural effusions. No pulmonary edema. Remote RIGHT rib fractures.
IMPRESSION: Negative.

## 2021-07-28 ENCOUNTER — Emergency Department (HOSPITAL_BASED_OUTPATIENT_CLINIC_OR_DEPARTMENT_OTHER): Payer: PPO

## 2021-07-28 ENCOUNTER — Encounter (HOSPITAL_BASED_OUTPATIENT_CLINIC_OR_DEPARTMENT_OTHER): Payer: Self-pay | Admitting: Emergency Medicine

## 2021-07-28 ENCOUNTER — Other Ambulatory Visit: Payer: Self-pay

## 2021-07-28 ENCOUNTER — Emergency Department (HOSPITAL_BASED_OUTPATIENT_CLINIC_OR_DEPARTMENT_OTHER)
Admission: EM | Admit: 2021-07-28 | Discharge: 2021-07-28 | Disposition: A | Discharge status: Home / Self Care | Payer: PPO | Attending: Emergency Medicine | Admitting: Emergency Medicine

## 2021-07-28 DIAGNOSIS — S50811A Abrasion of right forearm, initial encounter: Secondary | ICD-10-CM | POA: Insufficient documentation

## 2021-07-28 DIAGNOSIS — F0390 Unspecified dementia without behavioral disturbance: Secondary | ICD-10-CM | POA: Insufficient documentation

## 2021-07-28 DIAGNOSIS — S29012A Strain of muscle and tendon of back wall of thorax, initial encounter: Secondary | ICD-10-CM | POA: Diagnosis not present

## 2021-07-28 DIAGNOSIS — Z79899 Other long term (current) drug therapy: Secondary | ICD-10-CM | POA: Diagnosis not present

## 2021-07-28 DIAGNOSIS — E119 Type 2 diabetes mellitus without complications: Secondary | ICD-10-CM | POA: Diagnosis not present

## 2021-07-28 DIAGNOSIS — Y92009 Unspecified place in unspecified non-institutional (private) residence as the place of occurrence of the external cause: Secondary | ICD-10-CM | POA: Diagnosis not present

## 2021-07-28 DIAGNOSIS — Z794 Long term (current) use of insulin: Secondary | ICD-10-CM | POA: Insufficient documentation

## 2021-07-28 DIAGNOSIS — W133XXA Fall through floor, initial encounter: Secondary | ICD-10-CM | POA: Diagnosis not present

## 2021-07-28 DIAGNOSIS — S0081XA Abrasion of other part of head, initial encounter: Secondary | ICD-10-CM | POA: Diagnosis not present

## 2021-07-28 DIAGNOSIS — I10 Essential (primary) hypertension: Secondary | ICD-10-CM | POA: Insufficient documentation

## 2021-07-28 DIAGNOSIS — S0990XA Unspecified injury of head, initial encounter: Secondary | ICD-10-CM | POA: Diagnosis present

## 2021-07-28 DIAGNOSIS — S161XXA Strain of muscle, fascia and tendon at neck level, initial encounter: Secondary | ICD-10-CM | POA: Diagnosis not present

## 2021-07-28 DIAGNOSIS — S50812A Abrasion of left forearm, initial encounter: Secondary | ICD-10-CM | POA: Diagnosis not present

## 2021-07-28 DIAGNOSIS — J449 Chronic obstructive pulmonary disease, unspecified: Secondary | ICD-10-CM | POA: Insufficient documentation

## 2021-07-28 DIAGNOSIS — Z7951 Long term (current) use of inhaled steroids: Secondary | ICD-10-CM | POA: Insufficient documentation

## 2021-07-28 DIAGNOSIS — T148XXA Other injury of unspecified body region, initial encounter: Secondary | ICD-10-CM

## 2021-07-28 DIAGNOSIS — W19XXXA Unspecified fall, initial encounter: Secondary | ICD-10-CM

## 2021-07-28 DIAGNOSIS — S29019A Strain of muscle and tendon of unspecified wall of thorax, initial encounter: Secondary | ICD-10-CM

## 2021-07-28 NOTE — ED Provider Notes (Signed)
[place] EMERGENCY DEPARTMENT Provider Note   CSN: [id number] Arrival date & time: 07/28/21  0401     History  Chief Complaint  Patient presents with   [name]    Lisa Nelson is a 81 y.o. female.  Patient is an 81 year old female with history of dementia, diabetes, COPD, hypertension.  Patient presenting today with complaints of fall.  According to the daughter who lives with her, patient woke up to go to the bathroom, then fell on the floor.  She has abrasions to the forehead and both forearms.  Daughter states that she was complaining of pain in her neck and upper back, but patient denies this at present.  Due to dementia, she has no recollection of the incident.  The history is provided by the patient.       Home Medications Prior to Admission medications   Medication Sig Start Date End Date Taking? Authorizing Provider  albuterol (VENTOLIN HFA) 108 (90 Base) MCG/ACT inhaler Inhale 2 puffs into the lungs every 6 (six) hours as needed for wheezing. 10/05/19   [provider]  azelastine (ASTELIN) 0.1 % nasal spray Place 1 spray into both nostrils 2 (two) times daily. 06/19/19   [provider]  budesonide-formoterol (SYMBICORT) 160-4.5 MCG/ACT inhaler Inhale 2 puffs into the lungs in the morning and at bedtime. 10/02/20   [provider]  Cholecalciferol (VITAMIN D-1000 MAX ST) 25 MCG (1000 UT) tablet Take 1,000 Units by mouth daily.    [provider]  donepezil (ARICEPT) 10 MG tablet Take 10 mg by mouth at bedtime. 09/13/15   [provider]  hydrOXYzine (ATARAX/VISTARIL) 25 MG tablet 1/2 or 1 tab prn anxiety 07/29/18   [provider]  insulin NPH Human (NOVOLIN N) 100 UNIT/ML injection Inject 10-15 Units into the skin daily. 10/02/15   [provider]  levothyroxine (SYNTHROID) 75 MCG tablet Take 75 mcg by mouth daily before breakfast. 06/22/18   [provider]  lidocaine (LIDODERM) 5 % Place 1  patch onto the skin daily. Remove & Discard patch within 12 hours or as directed by MD 10/27/18   Gareth Morgan, MD  lisinopril (ZESTRIL) 10 MG tablet Take 10 mg by mouth in the morning and at bedtime. 01/26/18   [provider]  magnesium oxide (MAG-OX) 400 MG tablet Take 400 mg by mouth daily. 10/21/20   [provider]  memantine (NAMENDA) 10 MG tablet Take 10 mg by mouth 2 (two) times daily. 07/29/18   [provider]  oxyCODONE (ROXICODONE) 5 MG immediate release tablet Take 1 tablet (5 mg total) by mouth every 4 (four) hours as needed for severe pain. 10/27/18   Gareth Morgan, MD  sertraline (ZOLOFT) 50 MG tablet Take 1.5-2 tablets by mouth daily. 09/29/19   [provider]      Allergies    Patient has no known allergies.    Review of Systems   Review of Systems  Unable to perform ROS: Dementia    Physical Exam Updated Vital Signs BP 130/67   Pulse 73   Temp 98.2 F (36.8 C) (Oral)   Resp 17   SpO2 96%  Physical Exam Vitals and nursing note reviewed.  Constitutional:      General: She is not in acute distress.    Appearance: She is well-developed. She is not diaphoretic.  HENT:     Head: Normocephalic.     Comments: There are 2 superficial abrasions above the right eyebrow. Eyes:  Extraocular Movements: Extraocular movements intact.     Pupils: Pupils are equal, round, and reactive to light.  Neck:     Comments: There is some tenderness in the soft tissues of the cervical region.  There is no bony tenderness or step-off Cardiovascular:     Rate and Rhythm: Normal rate and regular rhythm.     Heart sounds: No murmur heard.    No friction rub. No gallop.  Pulmonary:     Effort: Pulmonary effort is normal. No respiratory distress.     Breath sounds: Normal breath sounds. No wheezing.  Abdominal:     General: Bowel sounds are normal. There is no distension.     Palpations: Abdomen is soft.     Tenderness: There is no  abdominal tenderness.  Musculoskeletal:        General: Normal range of motion.     Cervical back: Normal range of motion and neck supple.     Comments: There is some tenderness in the soft tissues of the upper thoracic region.  There is no bony tenderness or step-off.  Skin:    General: Skin is warm and dry.  Neurological:     General: No focal deficit present.     Mental Status: She is alert.     Cranial Nerves: No cranial nerve deficit.     Sensory: No sensory deficit.     Motor: No weakness.     Coordination: Coordination normal.     Comments: Patient is awake and alert.  She does have baseline confusion, but appears intact otherwise     ED Results / Procedures / Treatments   Labs (all labs ordered are listed, but only abnormal results are displayed) Labs Reviewed - No data to display  EKG None  Radiology No results found.  Procedures Procedures    Medications Ordered in ED Medications - No data to display  ED Course/ Medical Decision Making/ A&P  Patient presenting here with complaints of fall as described in the HPI.  Daughter reports that she was complaining of pain in her neck, upper back, and head, however patient does not complain of this to me.  She has dementia which somewhat limits history taking ability.  I did obtain CT scans of the head, cervical spine, and thoracic spine.  These were all negative.  Patient does have several skin tears to the forearms and an abrasion above the right thigh, however nothing that requires repair.  Daughter counseled on local wound care and is to follow-up as needed.  Final Clinical Impression(s) / ED Diagnoses Final diagnoses:  None    Rx / DC Orders ED Discharge Orders     None         Veryl Speak, MD 07/28/21 [phone]

## 2021-07-28 NOTE — ED Triage Notes (Signed)
Patient presents to ED via POV from home with family post unwitnessed fall. Per daughter, she heard a noise and when she went to check, she found patient on the ground on her back. Patient denies pain. History of dementia. Not on blood thinners.

## 2021-07-28 NOTE — Discharge Instructions (Signed)
Take Tylenol 650 mg every 6 hours as needed for pain.  Follow-up with primary doctor and return to the emergency department if you develop worsening headache, worsening pain, or for other new and concerning symptoms.

## 2021-09-12 DEATH — deceased

## 2022-06-24 IMAGING — CT CT ANGIO CHEST-ABD-PELV FOR DISSECTION W/ AND WO/W CM
2 of 9 series · 13 of 46 positions shown, 15 images · IV contrast (Omnipaque)
Comparison: [DATE] [DATE], [DATE]. [DATE] [DATE], [DATE]

CLINICAL DATA: chest pain, n/v, back pain, unequal pulses

EXAM:
CT ANGIOGRAPHY CHEST, ABDOMEN AND PELVIS
TECHNIQUE: Non-contrast CT of the chest was initially obtained.

[Series 5: axial arterial · axial · arterial · 0.72mm/px · z∈[+731,+1262]mm · 10 of 201 slices shown, 12 images]
[im 12/201  soft-tissue]
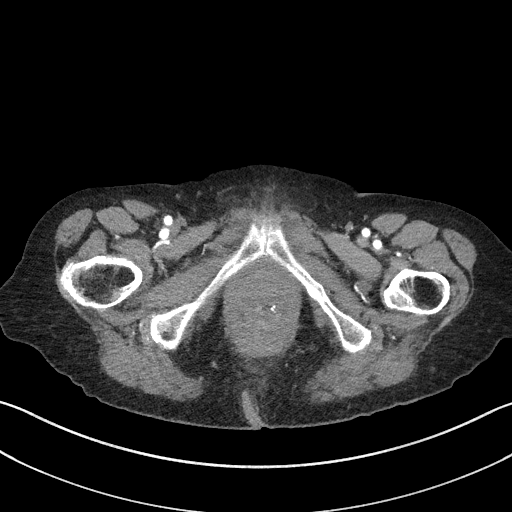
[im 12/201  bone]
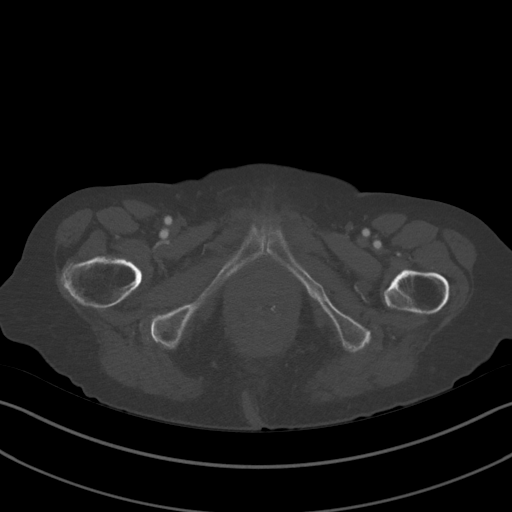
[im 34/201  soft-tissue]
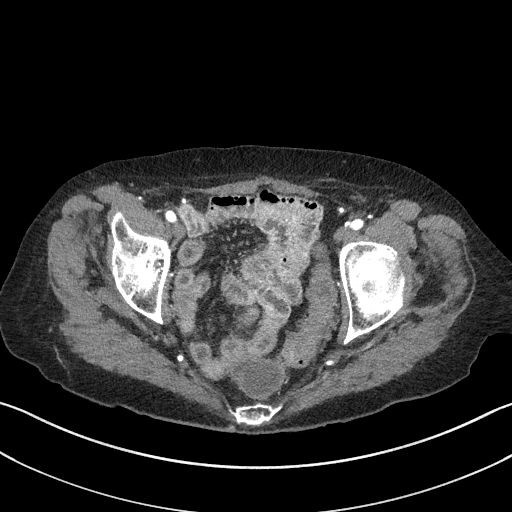
[im 56/201  soft-tissue]
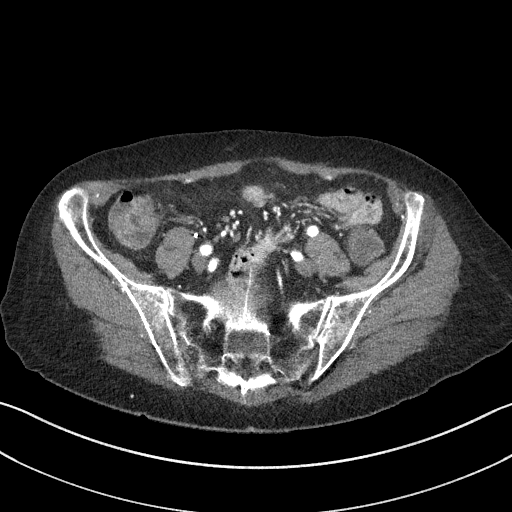
[im 67/201  soft-tissue]
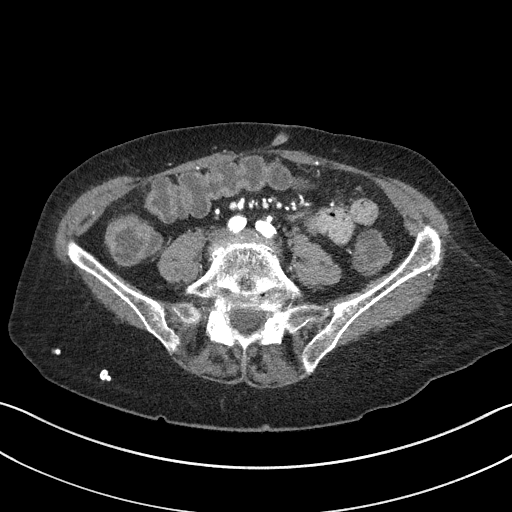
[im 89/201  soft-tissue]
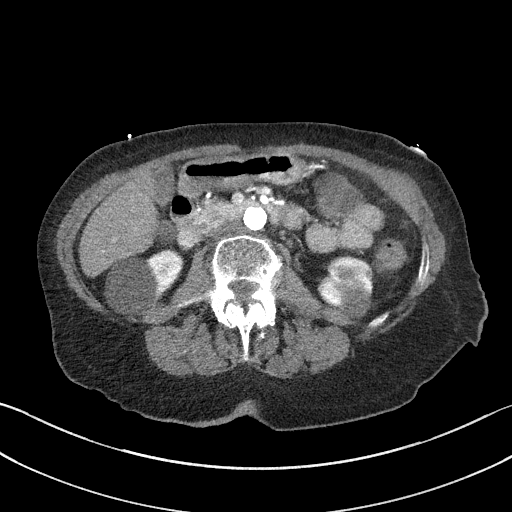
[im 112/201  soft-tissue]
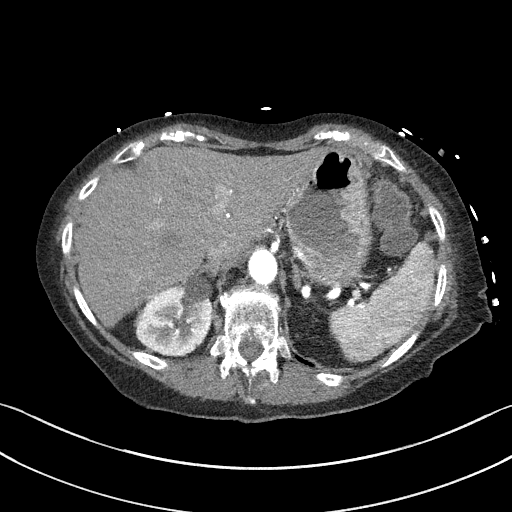
[im 134/201  soft-tissue]
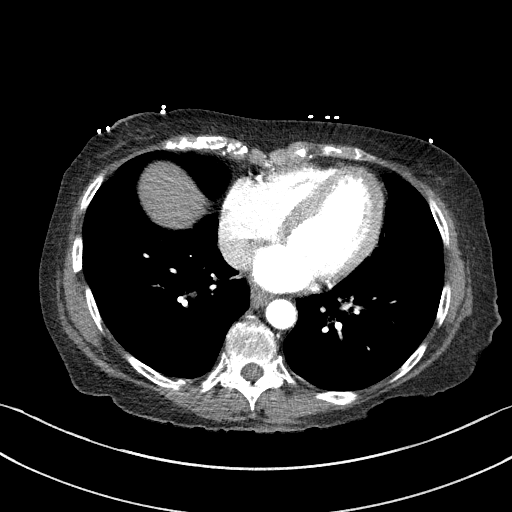
[im 145/201  soft-tissue]
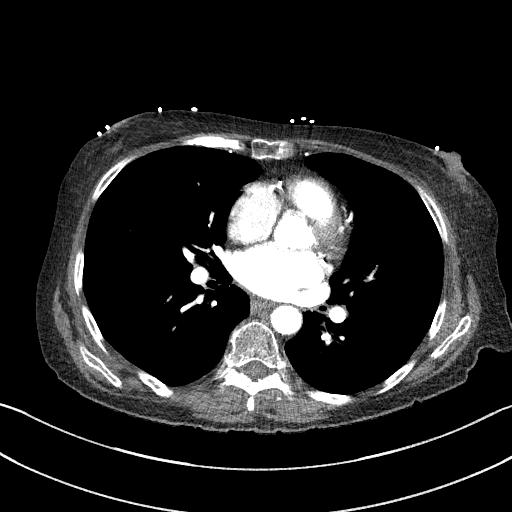
[im 167/201  soft-tissue]
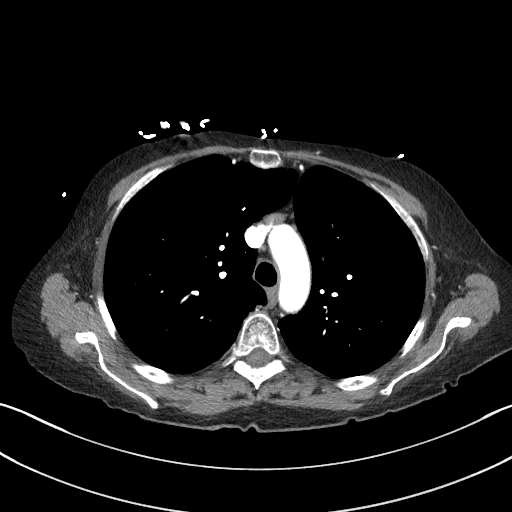
[im 167/201  bone]
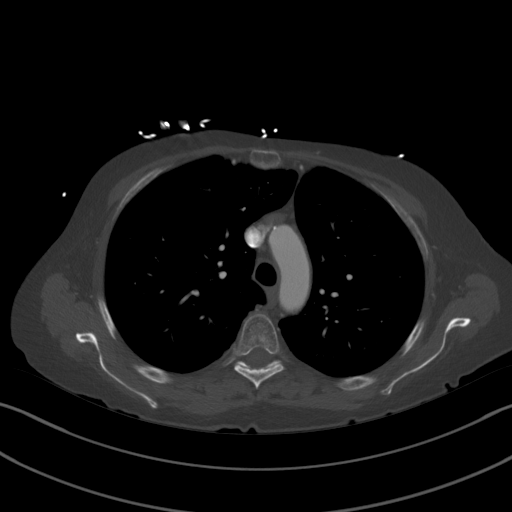
[im 189/201  soft-tissue]
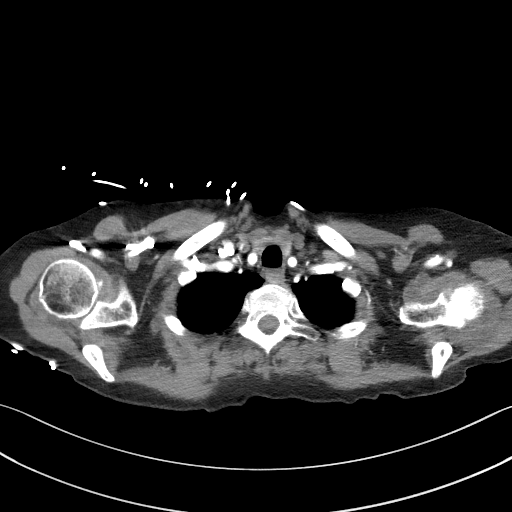

[Series 7: coronals · coronal · 0.77mm/px · 3 of 117 slices shown]
[im 30/117  soft-tissue]
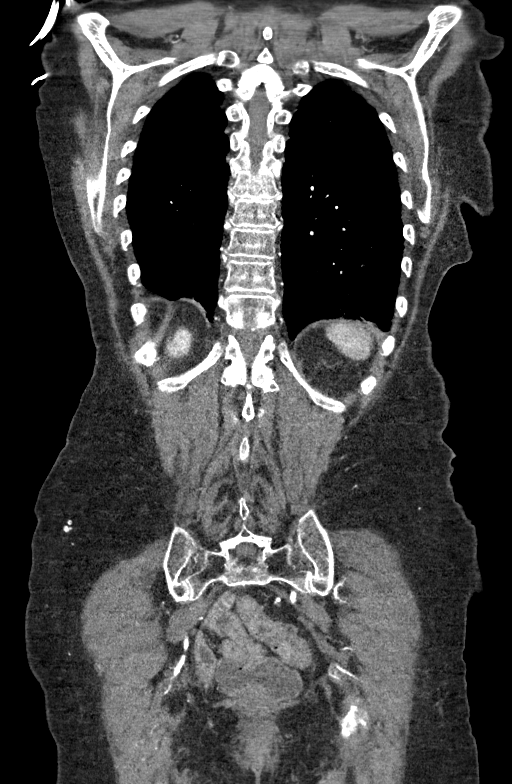
[im 59/117  soft-tissue]
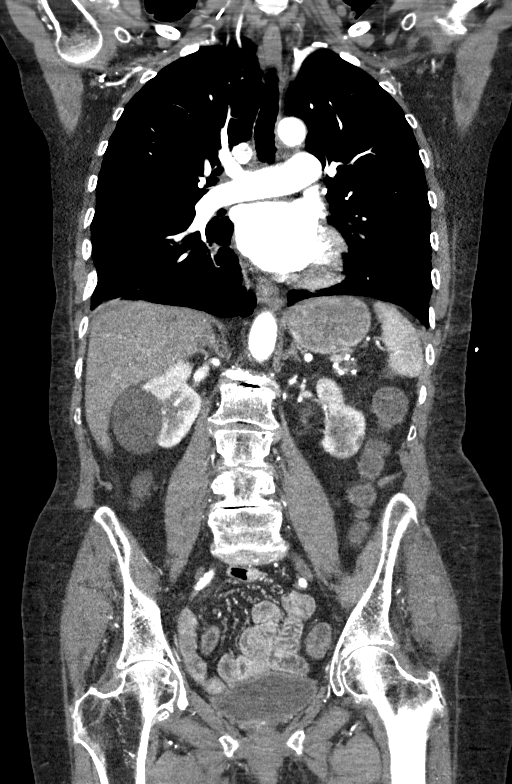
[im 88/117  soft-tissue]
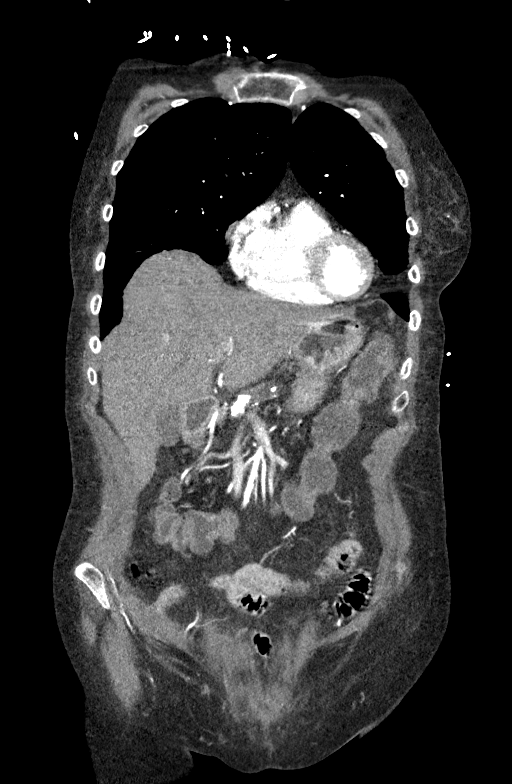

[13 of 46 positions shown; findings below may reference images not displayed]

Multidetector CT imaging through the chest, abdomen and pelvis was
performed using the standard protocol during bolus administration of
intravenous contrast. Multiplanar reconstructed images and MIPs were
obtained and reviewed to evaluate the vascular anatomy.

CONTRAST:  75mL OMNIPAQUE IOHEXOL 350 MG/ML SOLN
FINDINGS: CTA CHEST FINDINGS

Cardiovascular: Preferential opacification of the thoracic aorta. No
evidence of thoracic aortic aneurysm or dissection. Mildly enlarged
heart size. No pericardial effusion. Three-vessel coronary artery
atherosclerotic calcifications. Atherosclerotic calcifications of
the aorta.

Mediastinum/Nodes: Visualized thyroid is unremarkable. No axillary
or mediastinal adenopathy.

Lungs/Pleura: No pleural effusion or pneumothorax. There is a 4 mm
pulmonary nodule of the RIGHT lower lobe (series 5, image 50. There
is a 3 mm subpleural nodule the RIGHT lower lobe (series 5, image
51). There is a 2 mm pulmonary nodule the RIGHT upper lobe, stable
since 1217 and consistent with benign etiology (series 5, image 26).
Mild centrilobular emphysema.

Musculoskeletal: Remote RIGHT-sided rib fractures.

Review of the MIP images confirms the above findings.

CTA ABDOMEN AND PELVIS FINDINGS

VASCULAR

Aorta: Severe atherosclerotic calcifications throughout its course.
Normal in caliber.

Celiac: Patent without evidence of aneurysm, dissection, vasculitis
or significant stenosis.

SMA: Patent without evidence of aneurysm, dissection, vasculitis or
significant stenosis. Replaced RIGHT hepatic artery.

Renals: Atherosclerotic calcifications at the LEFT renal artery
origin results in likely moderate stenosis. Bilateral renal arteries
are patent.

IMA: Patent.

Inflow: Patent without evidence of aneurysm, dissection, vasculitis
or significant stenosis.

Veins: No obvious venous abnormality within the limitations of this
arterial phase study.

Review of the MIP images confirms the above findings.

NON-VASCULAR

Hepatobiliary: Unremarkable for phase of contrast. Gallbladder is
unremarkable. No extrahepatic biliary ductal dilation.

Pancreas: Visualization of multiple coarse calcifications and
diffuse pancreatic atrophy most consistent with sequela of chronic
calcific pancreatitis. No peripancreatic fat stranding.

Spleen: Unremarkable.

Adrenals/Urinary Tract: Adrenal glands are unremarkable. Multiple
bilateral renal cysts. Subcentimeter hypodense lesions are too small
to accurately characterize. Kidneys enhance symmetrically. No
hydronephrosis. There is a 32 mm cystic mass in the inferior pole of
LEFT kidney with a possible thin internal septation, incompletely
assessed (series 8, image 121; series 5, image 109). Bladder is
decompressed.

Stomach/Bowel: No evidence of bowel obstruction. Fluid-filled colon
consistent with history of diarrhea. No ancillary evidence of
appendicitis.

Lymphatic: No suspicious lymphadenopathy.

Reproductive: Status post hysterectomy. No adnexal masses.

Other: No free air or free fluid.

Musculoskeletal: Chronic bilateral pars defects with grade 1
anterolisthesis of L5-S1. Remote LEFT inferior pubic ramus fracture.
Degenerative changes of the lumbar spine.

Review of the MIP images confirms the above findings.
IMPRESSION: 1. No evidence of acute aortic injury.
2. Fluid-filled colon consistent with history of diarrhea. No
evidence of bowel obstruction.
3. Revisualization of the sequela of chronic calcific pancreatitis.
4. Scattered pulmonary nodules measuring up to 4 mm. No follow-up
needed if patient is low-risk (and has no known or suspected primary
neoplasm). Non-contrast chest CT can be considered in 12 months if
patient is high-risk. This recommendation follows the consensus
statement: Guidelines for Management of Incidental Pulmonary Nodules
Detected on CT Images: From the [HOSPITAL] 0505; Radiology
0505; [DATE].
5. There is a 32 mm cystic mass of the LEFT kidney with a likely
enhancing internal septation. This is incompletely assessed and
likely reflects a benign minimally complicated cyst versus a Bosniak
IIF mass. Consider further evaluation with renal cyst protocol
contrast enhanced MRI when clinically appropriate. The large
majority of Bosniak IIF masses are benign. When malignant, nearly
all are indolent. Generally, Bosniak IIF masses are followed by
[HOSPITAL] 6 months and 12 months (MRI preferred over CT), then
annually for a total of 5 years to assess for morphologic change.
(Reference: Bosniak Classification of Cystic Renal Masses, Version

Aortic Atherosclerosis (V3KQ6-86X.X) and Emphysema (V3KQ6-M70.8).
